# Patient Record
Sex: Female | Born: 1965 | Race: Black or African American | Hispanic: No | Marital: Single | Smoking: Never smoker
Health system: Southern US, Community
[De-identification: ages and names within clinical notes are randomized; demographics above are authoritative.]

## PROBLEM LIST (undated history)

## (undated) DIAGNOSIS — I10 Essential (primary) hypertension: Secondary | ICD-10-CM

---

## 2010-06-08 ENCOUNTER — Encounter (INDEPENDENT_AMBULATORY_CARE_PROVIDER_SITE_OTHER): Payer: Self-pay | Admitting: *Deleted

## 2010-06-08 LAB — CONVERTED CEMR LAB
ALT: 30 units/L (ref 0–35)
AST: 27 units/L (ref 0–37)
Alkaline Phosphatase: 49 units/L (ref 39–117)
Basophils Absolute: 0.1 10*3/uL (ref 0.0–0.1)
Basophils Relative: 1 % (ref 0–1)
CO2: 21 meq/L (ref 19–32)
Creatinine, Ser: 0.83 mg/dL (ref 0.40–1.20)
Eosinophils Relative: 2 % (ref 0–5)
HCT: 22.9 % — ABNORMAL LOW (ref 36.0–46.0)
Lymphocytes Relative: 25 % (ref 12–46)
MCHC: 27.1 g/dL — ABNORMAL LOW (ref 30.0–36.0)
Platelets: 597 10*3/uL — ABNORMAL HIGH (ref 150–400)
RDW: 19.7 % — ABNORMAL HIGH (ref 11.5–15.5)
Sodium: 142 meq/L (ref 135–145)
TSH: 0.94 microintl units/mL (ref 0.350–4.500)
Total Bilirubin: 0.4 mg/dL (ref 0.3–1.2)
Total Protein: 6.6 g/dL (ref 6.0–8.3)

## 2010-06-09 ENCOUNTER — Emergency Department (HOSPITAL_COMMUNITY): Admission: EM | Admit: 2010-06-09 | Discharge: 2010-06-10 | Payer: Self-pay | Admitting: Emergency Medicine

## 2010-06-09 IMAGING — US US TRANSVAGINAL NON-OB
1 series · 14 of 25 positions shown · non-contrast
Comparison: None.

CLINICAL DATA: Heavy vaginal bleeding.  Hypotensive.



[Series 1: us transvaginal non-ob · 0.28mm/px · 14 of 56 slices shown]
[im 1/56]
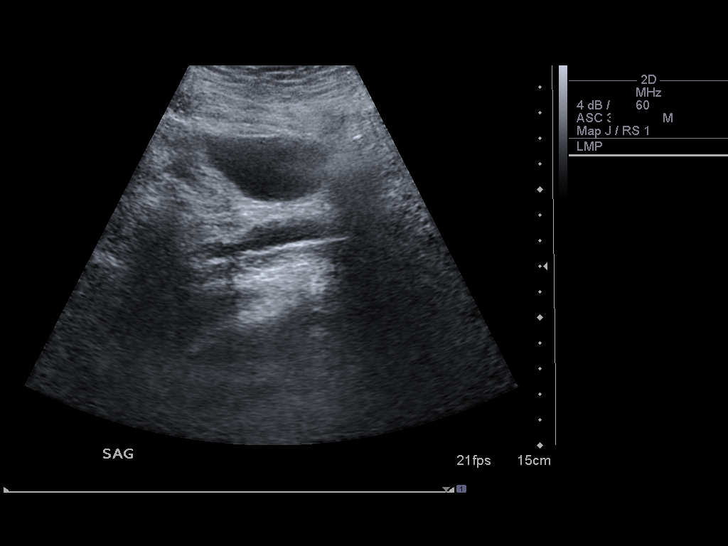
[im 5/56]
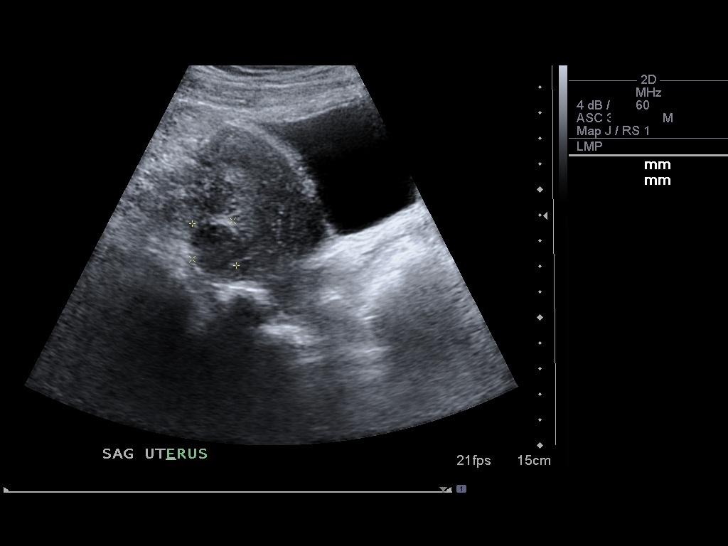
[im 10/56]
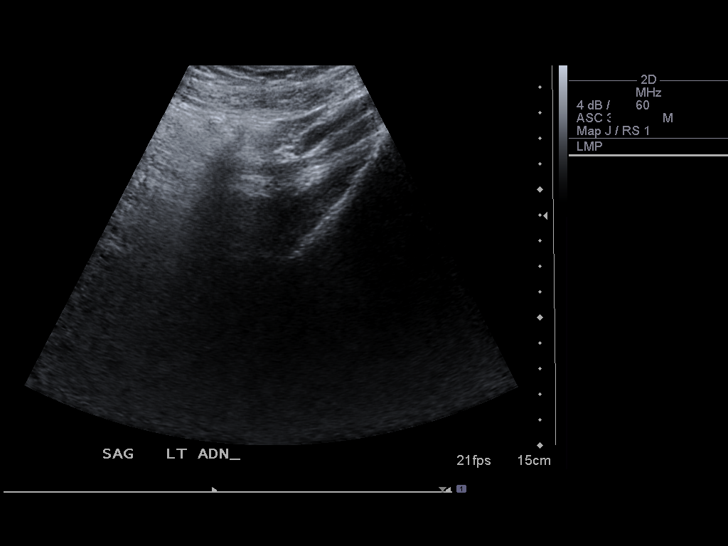
[im 14/56]
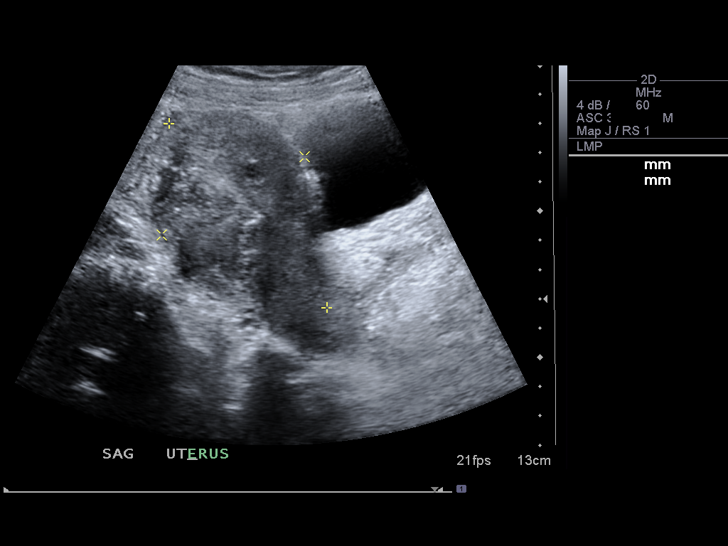
[im 19/56]
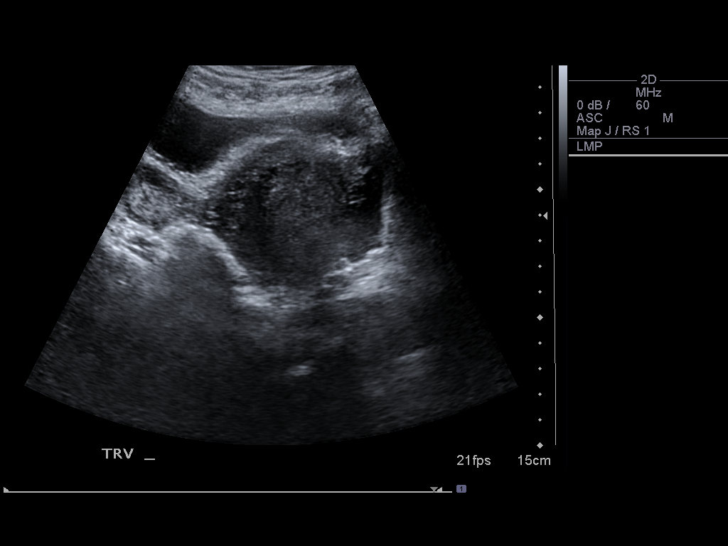
[im 21/56]
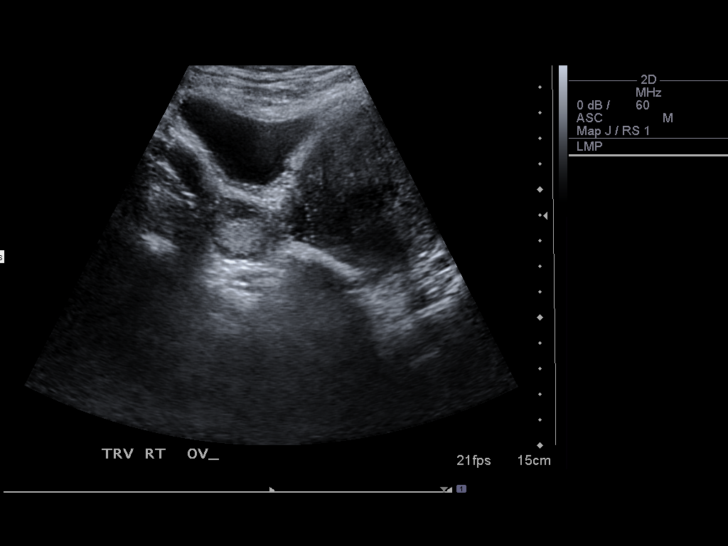
[im 26/56]
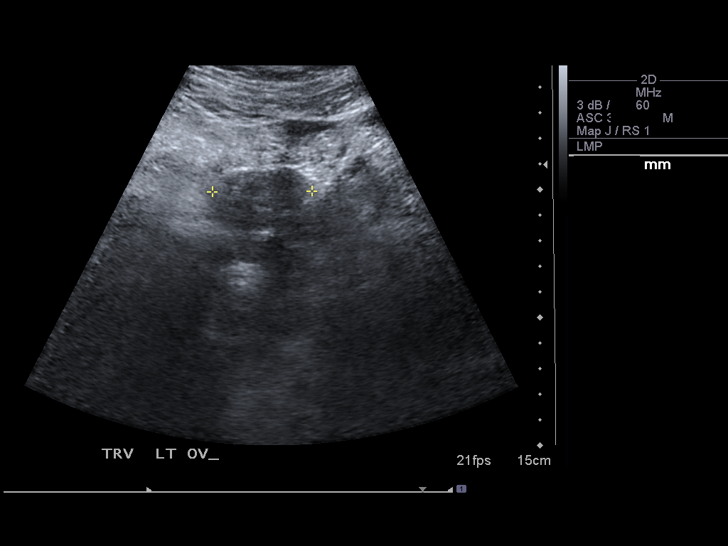
[im 30/56]
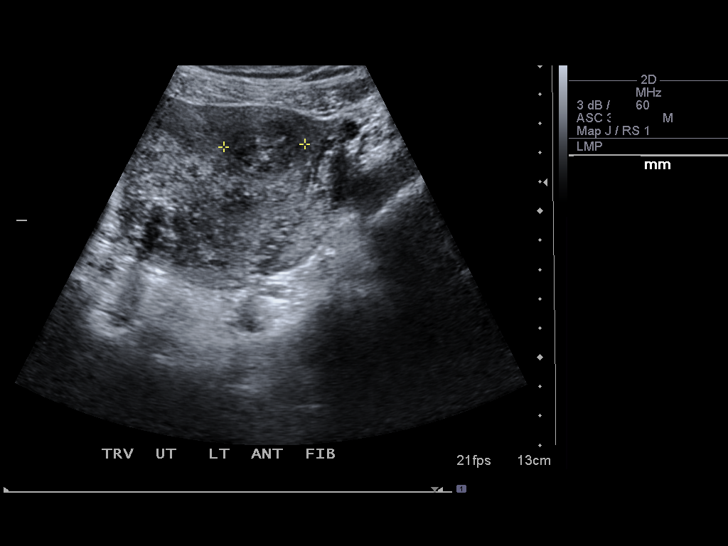
[im 35/56]
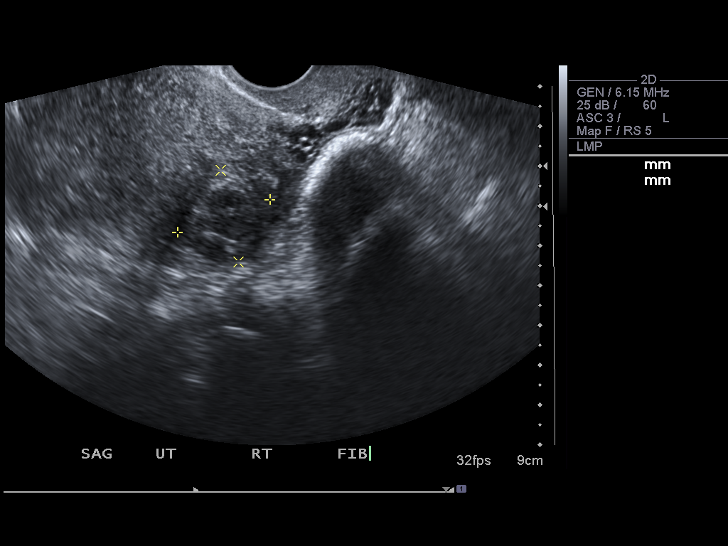
[im 37/56]
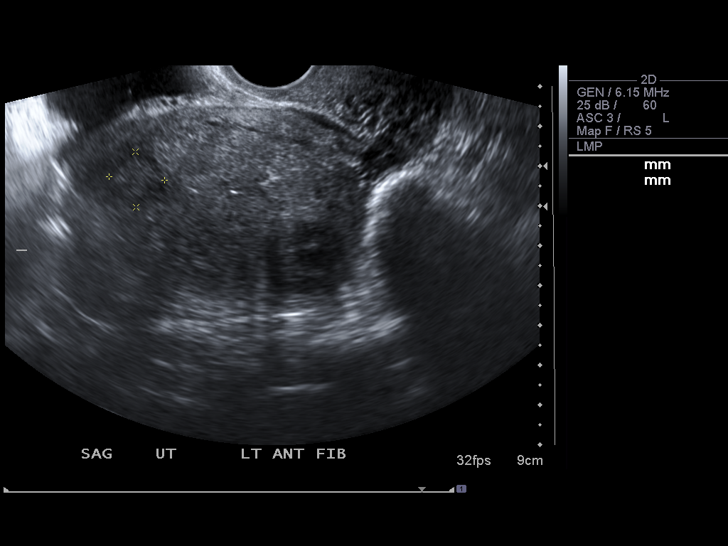
[im 42/56]
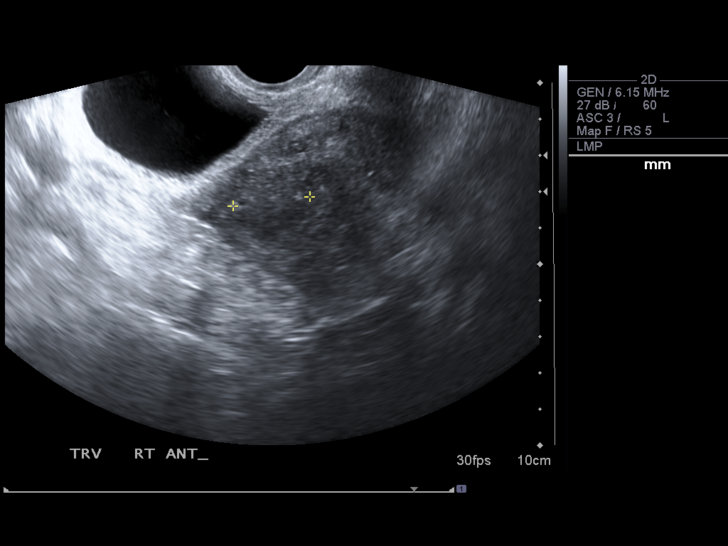
[im 46/56]
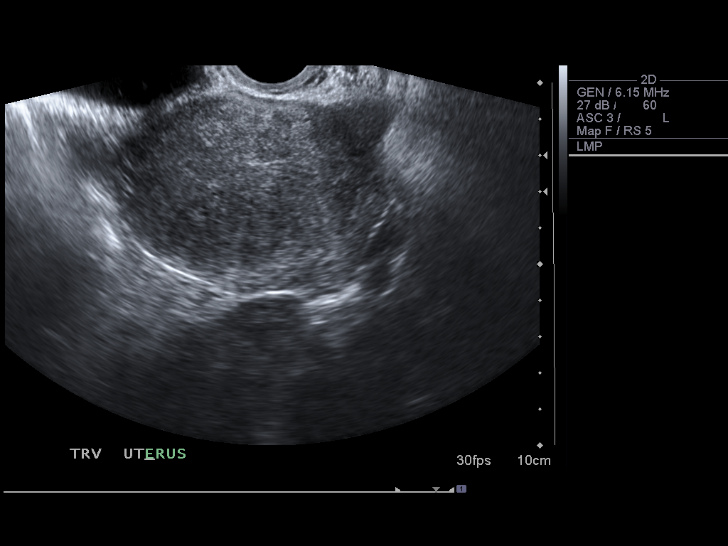
[im 51/56]
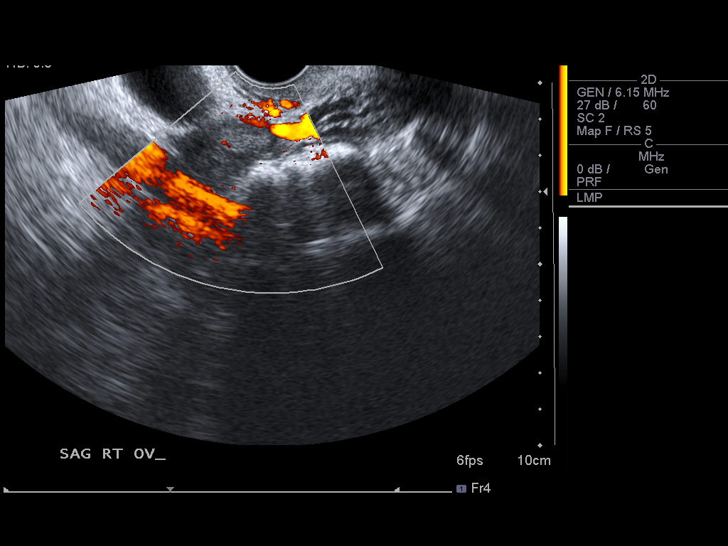
[im 56/56]
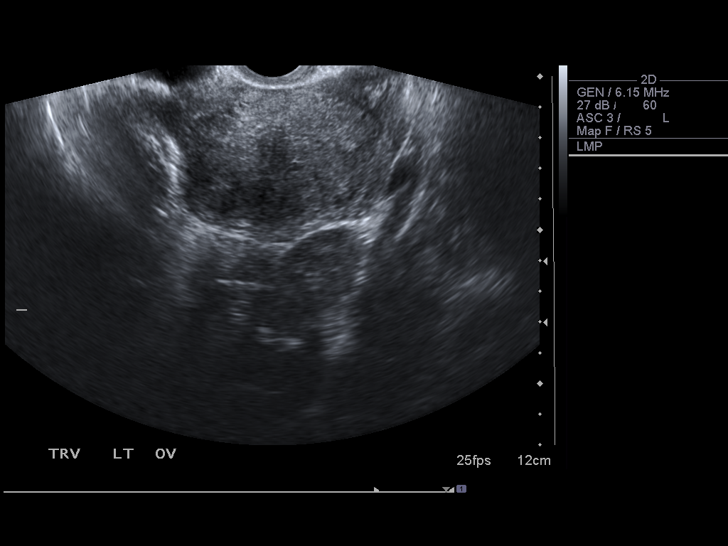

[14 of 25 positions shown; findings below may reference images not displayed]

FINDINGS: Uterus measures 8.3 x 5.6 x 7.1 cm. Uterine myometrium is diffusely
heterogeneous in echotexture.  At least three discrete fibroids are
seen, which range in size from 2.5-2.9 cm.

Endometrium measures 4 mm in thickness.  Minimal fluid or blood
noted in endometrial cavity.

Right Ovary measures 4.2 x 2.8 x 2.5 cm. Normal appearance.

Left Ovary measures 3.6 x 2.6 x 3.9 cm.  Normal appearance.

Other Findings:  No other abnormality identified.
IMPRESSION: 1.  Several small uterine fibroids measuring between two and 3 cm
in diameter.
2.  Normal ovaries.  No evidence of adnexal mass or free fluid.

## 2010-07-13 ENCOUNTER — Encounter (INDEPENDENT_AMBULATORY_CARE_PROVIDER_SITE_OTHER): Payer: Self-pay | Admitting: *Deleted

## 2010-07-13 LAB — CONVERTED CEMR LAB
Eosinophils Absolute: 0.1 10*3/uL (ref 0.0–0.7)
Eosinophils Relative: 1 % (ref 0–5)
Folate: >20 ng/mL
HCT: 41.7 % (ref 36.0–46.0)
Hemoglobin: 13.2 g/dL (ref 12.0–15.0)
Lymphocytes Relative: 20 % (ref 12–46)
Lymphs Abs: 1.2 10*3/uL (ref 0.7–4.0)
Monocytes Absolute: 0.5 10*3/uL (ref 0.1–1.0)
Monocytes Relative: 7 % (ref 3–12)
RBC: 5.14 M/uL — ABNORMAL HIGH (ref 3.87–5.11)
Saturation Ratios: 12 % — ABNORMAL LOW (ref 20–55)
TIBC: 391 ug/dL (ref 250–470)
WBC: 6.2 10*3/uL (ref 4.0–10.5)

## 2010-10-04 ENCOUNTER — Encounter (INDEPENDENT_AMBULATORY_CARE_PROVIDER_SITE_OTHER): Payer: Self-pay | Admitting: *Deleted

## 2010-10-04 LAB — CONVERTED CEMR LAB
Basophils Absolute: 0 10*3/uL (ref 0.0–0.1)
Basophils Relative: 1 % (ref 0–1)
Eosinophils Relative: 3 % (ref 0–5)
HCT: 40.8 % (ref 36.0–46.0)
Hemoglobin: 13.6 g/dL (ref 12.0–15.0)
MCHC: 33.3 g/dL (ref 30.0–36.0)
Monocytes Absolute: 0.3 10*3/uL (ref 0.1–1.0)
Monocytes Relative: 8 % (ref 3–12)
Neutro Abs: 2.1 10*3/uL (ref 1.7–7.7)
RBC: 4.85 M/uL (ref 3.87–5.11)
RDW: 13.2 % (ref 11.5–15.5)

## 2010-10-25 LAB — CBC
HCT: 20.8 % — ABNORMAL LOW (ref 36.0–46.0)
MCHC: 29.3 g/dL — ABNORMAL LOW (ref 30.0–36.0)
MCV: 64 fL — ABNORMAL LOW (ref 78.0–100.0)
Platelets: 676 10*3/uL — ABNORMAL HIGH (ref 150–400)
RDW: 19.5 % — ABNORMAL HIGH (ref 11.5–15.5)
WBC: 5.8 10*3/uL (ref 4.0–10.5)

## 2010-10-25 LAB — DIFFERENTIAL
Basophils Absolute: 0.1 10*3/uL (ref 0.0–0.1)
Basophils Relative: 1 % (ref 0–1)
Eosinophils Absolute: 0.1 10*3/uL (ref 0.0–0.7)
Lymphs Abs: 1.7 10*3/uL (ref 0.7–4.0)
Monocytes Absolute: 0.3 10*3/uL (ref 0.1–1.0)
Neutro Abs: 3.6 10*3/uL (ref 1.7–7.7)

## 2010-10-25 LAB — BASIC METABOLIC PANEL
BUN: 6 mg/dL (ref 6–23)
Chloride: 112 mEq/L (ref 96–112)
Creatinine, Ser: 0.77 mg/dL (ref 0.4–1.2)
GFR calc non Af Amer: >60 mL/min (ref >60)
Glucose, Bld: 90 mg/dL (ref 70–99)
Potassium: 3.6 mEq/L (ref 3.5–5.1)

## 2013-05-01 ENCOUNTER — Other Ambulatory Visit: Payer: Self-pay | Admitting: Specialist

## 2013-05-01 ENCOUNTER — Ambulatory Visit
Admission: RE | Admit: 2013-05-01 | Discharge: 2013-05-01 | Disposition: A | Discharge status: Home / Self Care | Payer: Self-pay | Source: Ambulatory Visit | Attending: Specialist | Admitting: Specialist

## 2013-05-01 DIAGNOSIS — R6889 Other general symptoms and signs: Secondary | ICD-10-CM

## 2013-05-01 IMAGING — CR DG CHEST 1V
1 series · 1 of 1 positions shown · non-contrast
Comparison: None.

CLINICAL DATA: Positive PPD. Asymptomatic.

EXAM:
CHEST - 1 VIEW

[w chest pa]
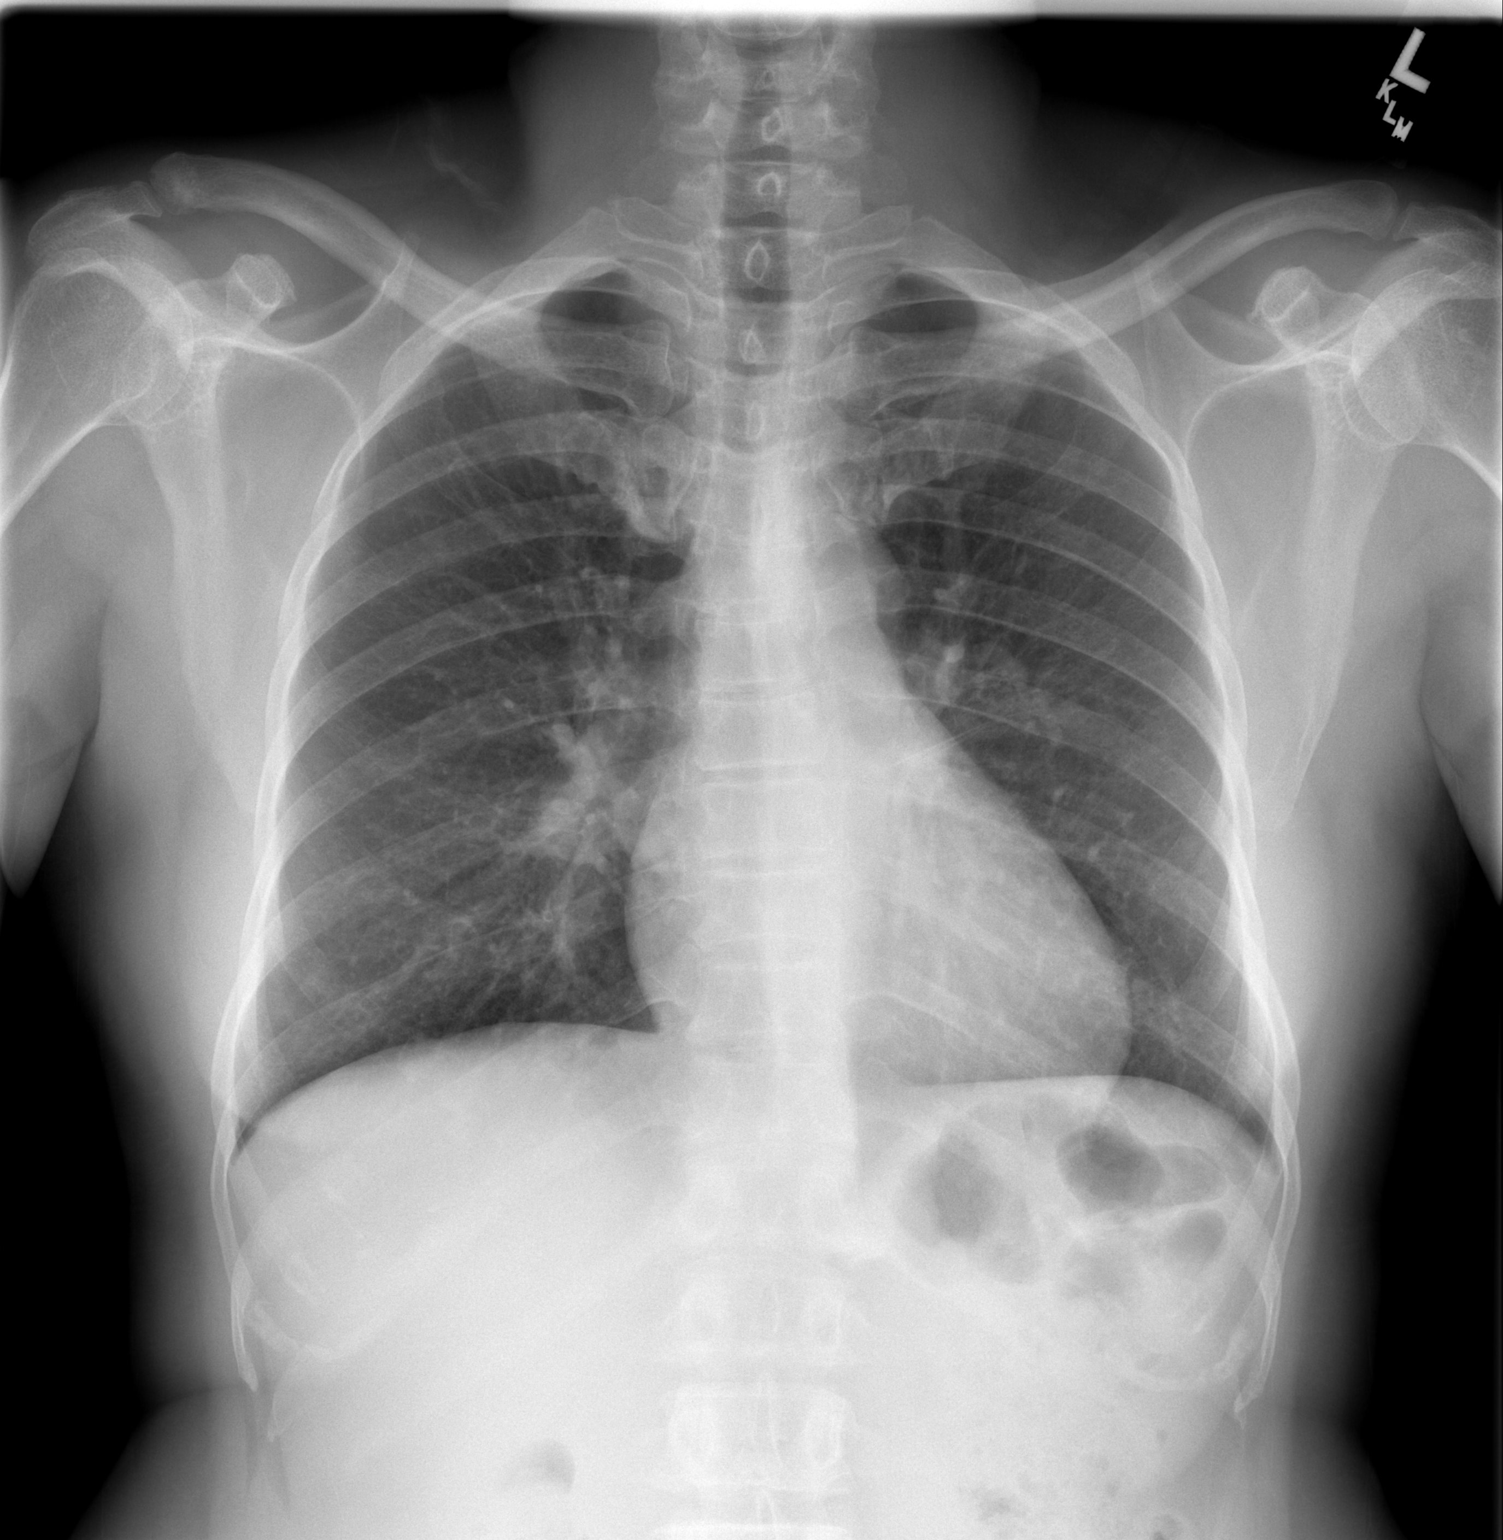

[1 of 1 positions shown; findings below may reference images not displayed]

FINDINGS: The heart size and mediastinal contours are within normal limits.
Both lungs are clear. The visualized skeletal structures are
unremarkable.
IMPRESSION: No active cardiopulmonary process. No radiographic evidence of
tuberculosis.

## 2015-05-05 ENCOUNTER — Other Ambulatory Visit (HOSPITAL_COMMUNITY)
Admission: RE | Admit: 2015-05-05 | Discharge: 2015-05-05 | Disposition: A | Discharge status: Home / Self Care | Payer: Managed Care, Other (non HMO) | Source: Ambulatory Visit | Attending: Internal Medicine | Admitting: Internal Medicine

## 2015-05-05 DIAGNOSIS — Z01419 Encounter for gynecological examination (general) (routine) without abnormal findings: Secondary | ICD-10-CM | POA: Insufficient documentation

## 2016-05-29 ENCOUNTER — Other Ambulatory Visit: Payer: Self-pay | Admitting: Internal Medicine

## 2016-05-29 DIAGNOSIS — M5440 Lumbago with sciatica, unspecified side: Secondary | ICD-10-CM

## 2016-06-23 ENCOUNTER — Ambulatory Visit
Admission: RE | Admit: 2016-06-23 | Discharge: 2016-06-23 | Disposition: A | Discharge status: Home / Self Care | Payer: Managed Care, Other (non HMO) | Source: Ambulatory Visit | Attending: Internal Medicine | Admitting: Internal Medicine

## 2016-06-23 DIAGNOSIS — M5440 Lumbago with sciatica, unspecified side: Secondary | ICD-10-CM

## 2016-09-17 ENCOUNTER — Ambulatory Visit: Payer: BLUE CROSS/BLUE SHIELD | Admitting: Physical Therapy

## 2016-12-24 DIAGNOSIS — M792 Neuralgia and neuritis, unspecified: Secondary | ICD-10-CM | POA: Diagnosis not present

## 2016-12-24 DIAGNOSIS — R5383 Other fatigue: Secondary | ICD-10-CM | POA: Diagnosis not present

## 2016-12-24 DIAGNOSIS — G5602 Carpal tunnel syndrome, left upper limb: Secondary | ICD-10-CM | POA: Diagnosis not present

## 2017-01-03 DIAGNOSIS — M5136 Other intervertebral disc degeneration, lumbar region: Secondary | ICD-10-CM | POA: Diagnosis not present

## 2017-01-04 ENCOUNTER — Other Ambulatory Visit: Payer: Self-pay | Admitting: Internal Medicine

## 2017-01-04 DIAGNOSIS — M5136 Other intervertebral disc degeneration, lumbar region: Secondary | ICD-10-CM

## 2017-01-11 ENCOUNTER — Ambulatory Visit
Admission: RE | Admit: 2017-01-11 | Discharge: 2017-01-11 | Disposition: A | Discharge status: Home / Self Care | Payer: BLUE CROSS/BLUE SHIELD | Source: Ambulatory Visit | Attending: Internal Medicine | Admitting: Internal Medicine

## 2017-01-11 DIAGNOSIS — M47817 Spondylosis without myelopathy or radiculopathy, lumbosacral region: Secondary | ICD-10-CM | POA: Diagnosis not present

## 2017-01-11 DIAGNOSIS — M5136 Other intervertebral disc degeneration, lumbar region: Secondary | ICD-10-CM

## 2017-01-11 IMAGING — XA Imaging study
1 series · 1 of 1 positions shown · non-contrast
Comparison: none

CLINICAL DATA: Lumbosacral spondylosis without myelopathy. LEFT leg
radicular symptoms.

[Series 1: ortho standard · 1 of 1 slices shown]
[im 1/1]
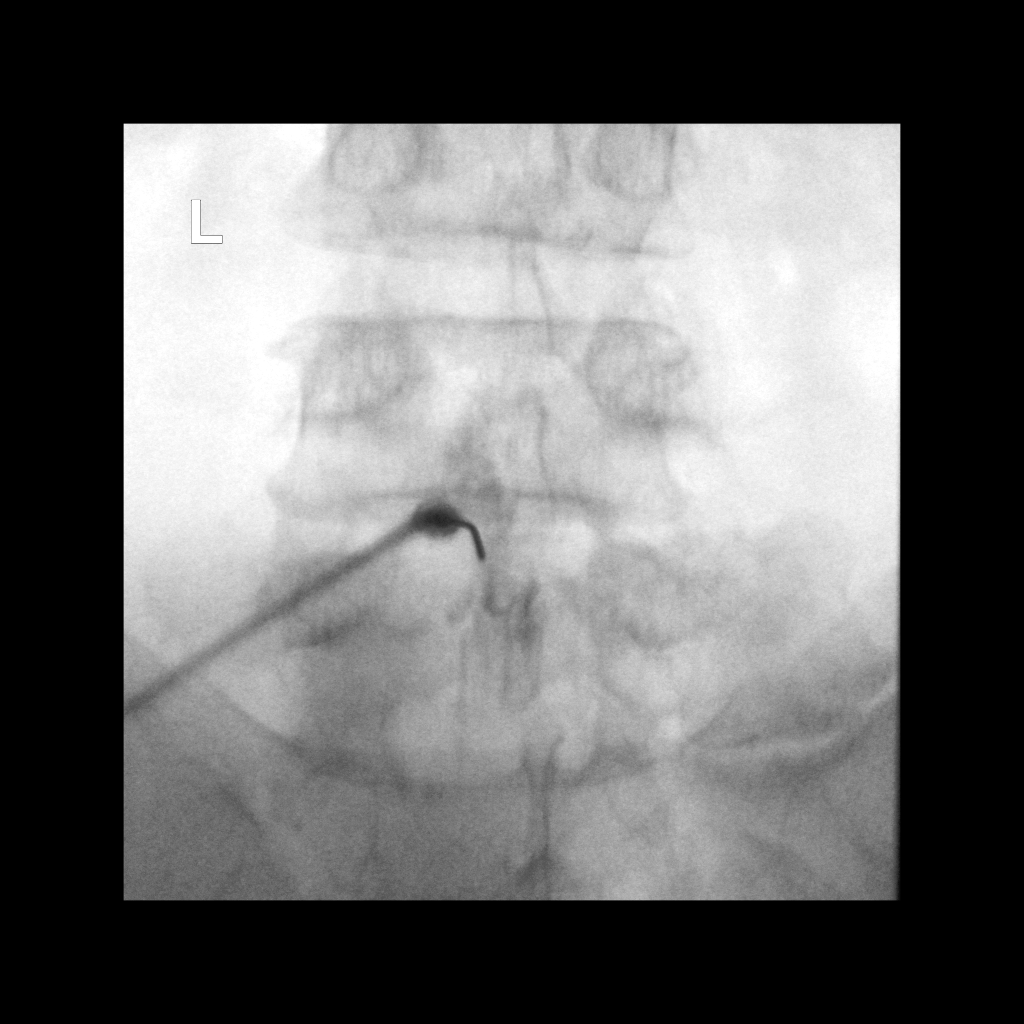

[1 of 1 positions shown; findings below may reference images not displayed]

FLUOROSCOPY TIME:  7 seconds corresponding to a Dose Area Product of
11.73 ?Gy*m2

PROCEDURE:
The procedure, risks, benefits, and alternatives were explained to
the patient. Questions regarding the procedure were encouraged and
answered. The patient understands and consents to the procedure.

LUMBAR EPIDURAL INJECTION:

An interlaminar approach was performed on the LEFT at L4-5. The
overlying skin was cleansed and anesthetized. A 20 gauge epidural
needle was advanced using loss-of-resistance technique.

DIAGNOSTIC EPIDURAL INJECTION:

Injection of Isovue-M 200 shows a good epidural pattern with spread
above and below the level of needle placement, primarily on the
LEFT; no vascular opacification is seen.

THERAPEUTIC EPIDURAL INJECTION:

120.0 Mg of Depo-Medrol mixed with 2 mL 1% lidocaine were instilled.
The procedure was well-tolerated, and the patient was discharged
thirty minutes following the injection in good condition.

COMPLICATIONS:
None.
IMPRESSION: Technically successful epidural injection on the LEFT L4-5 # 1.

## 2017-01-11 MED ORDER — IOPAMIDOL (ISOVUE-M 200) INJECTION 41%
1.0000 mL | Freq: Once | INTRAMUSCULAR | Status: AC
  Administered 2017-01-11: 1 mL via EPIDURAL

## 2017-01-11 MED ORDER — METHYLPREDNISOLONE ACETATE 40 MG/ML INJ SUSP (RADIOLOG
120.0000 mg | Freq: Once | INTRAMUSCULAR | Status: AC
  Administered 2017-01-11: 120 mg via EPIDURAL

## 2017-01-11 NOTE — Discharge Instructions (Signed)

## 2017-07-08 ENCOUNTER — Other Ambulatory Visit: Payer: Self-pay | Admitting: Internal Medicine

## 2017-07-08 DIAGNOSIS — M5136 Other intervertebral disc degeneration, lumbar region: Secondary | ICD-10-CM

## 2017-07-09 ENCOUNTER — Other Ambulatory Visit: Payer: Self-pay | Admitting: Internal Medicine

## 2017-07-09 DIAGNOSIS — M5136 Other intervertebral disc degeneration, lumbar region: Secondary | ICD-10-CM

## 2017-07-11 DIAGNOSIS — Z131 Encounter for screening for diabetes mellitus: Secondary | ICD-10-CM | POA: Diagnosis not present

## 2017-07-11 DIAGNOSIS — Z0000 Encounter for general adult medical examination without abnormal findings: Secondary | ICD-10-CM | POA: Diagnosis not present

## 2017-07-11 DIAGNOSIS — I10 Essential (primary) hypertension: Secondary | ICD-10-CM | POA: Diagnosis not present

## 2017-07-16 ENCOUNTER — Ambulatory Visit
Admission: RE | Admit: 2017-07-16 | Discharge: 2017-07-16 | Disposition: A | Discharge status: Home / Self Care | Payer: BLUE CROSS/BLUE SHIELD | Source: Ambulatory Visit | Attending: Internal Medicine | Admitting: Internal Medicine

## 2017-07-16 DIAGNOSIS — M5126 Other intervertebral disc displacement, lumbar region: Secondary | ICD-10-CM | POA: Diagnosis not present

## 2017-07-16 DIAGNOSIS — M5136 Other intervertebral disc degeneration, lumbar region: Secondary | ICD-10-CM

## 2017-07-16 IMAGING — XA Imaging study
2 series · 2 of 2 positions shown · non-contrast
Comparison: none

CLINICAL DATA: Spondylosis without myelopathy. Disc herniation
L4-5. Advanced facet arthropathy. Some improvement from the previous
injection but with recurrence of symptoms.

[Series 1: ortho standard · 1 of 1 slices shown (1 of 2)]
[im 1/1]
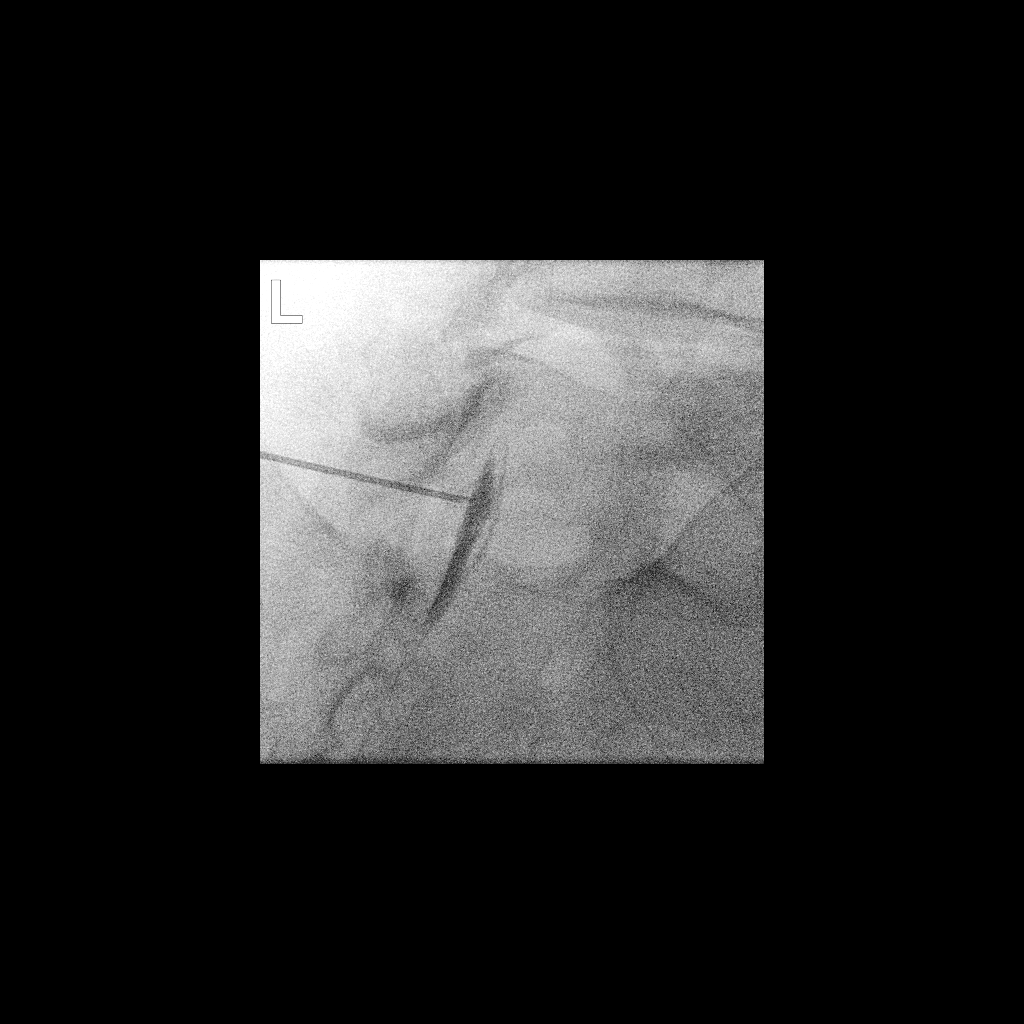

[Series 2: ortho standard · 1 of 1 slices shown (2 of 2)]
[im 1/1]
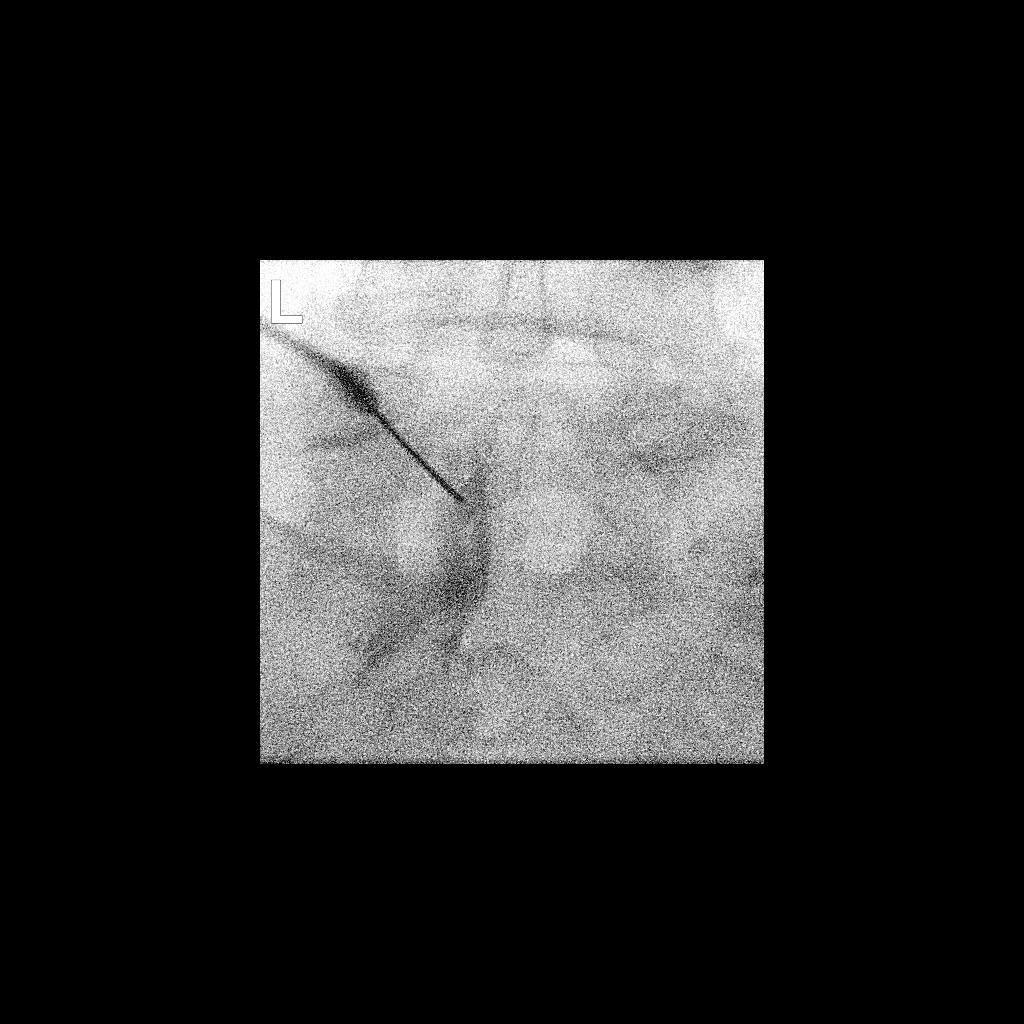

[2 of 2 positions shown; findings below may reference images not displayed]

FLUOROSCOPY TIME:  0 minutes 15 seconds. 11.64 micro gray meter
squared

PROCEDURE:
The procedure, risks, benefits, and alternatives were explained to
the patient. Questions regarding the procedure were encouraged and
answered. The patient understands and consents to the procedure.

LUMBAR EPIDURAL INJECTION:

An interlaminar approach was performed on left at L5-S1. The
overlying skin was cleansed and anesthetized. A 20 gauge epidural
needle was advanced using loss-of-resistance technique.

DIAGNOSTIC EPIDURAL INJECTION:

Injection of Isovue-M 200 shows a good epidural pattern with spread
above and below the level of needle placement, primarily on the
left. No vascular opacification is seen.

THERAPEUTIC EPIDURAL INJECTION:

One hundred twenty Mg of Depo-Medrol mixed with 2.5 cc 1% lidocaine
were instilled. The procedure was well-tolerated, and the patient
was discharged thirty minutes following the injection in good
condition.

COMPLICATIONS:
None
IMPRESSION: Technically successful epidural injection on the left at L5-S1 # 2.

## 2017-07-16 MED ORDER — IOPAMIDOL (ISOVUE-M 200) INJECTION 41%
1.0000 mL | Freq: Once | INTRAMUSCULAR | Status: AC
  Administered 2017-07-16: 1 mL via EPIDURAL

## 2017-07-16 MED ORDER — METHYLPREDNISOLONE ACETATE 40 MG/ML INJ SUSP (RADIOLOG
120.0000 mg | Freq: Once | INTRAMUSCULAR | Status: AC
  Administered 2017-07-16: 120 mg via EPIDURAL

## 2017-07-16 NOTE — Discharge Instructions (Signed)

## 2017-07-18 DIAGNOSIS — Z0000 Encounter for general adult medical examination without abnormal findings: Secondary | ICD-10-CM | POA: Diagnosis not present

## 2017-07-18 DIAGNOSIS — Z23 Encounter for immunization: Secondary | ICD-10-CM | POA: Diagnosis not present

## 2017-07-18 DIAGNOSIS — I10 Essential (primary) hypertension: Secondary | ICD-10-CM | POA: Diagnosis not present

## 2017-07-18 DIAGNOSIS — M5136 Other intervertebral disc degeneration, lumbar region: Secondary | ICD-10-CM | POA: Diagnosis not present

## 2017-08-15 DIAGNOSIS — Z23 Encounter for immunization: Secondary | ICD-10-CM | POA: Diagnosis not present

## 2017-09-03 ENCOUNTER — Encounter: Payer: Self-pay | Admitting: Internal Medicine

## 2017-12-10 DIAGNOSIS — M5136 Other intervertebral disc degeneration, lumbar region: Secondary | ICD-10-CM | POA: Diagnosis not present

## 2018-01-30 DIAGNOSIS — M545 Low back pain: Secondary | ICD-10-CM | POA: Diagnosis not present

## 2018-01-30 DIAGNOSIS — M4316 Spondylolisthesis, lumbar region: Secondary | ICD-10-CM | POA: Diagnosis not present

## 2018-01-30 DIAGNOSIS — M48062 Spinal stenosis, lumbar region with neurogenic claudication: Secondary | ICD-10-CM | POA: Diagnosis not present

## 2018-02-27 DIAGNOSIS — M4316 Spondylolisthesis, lumbar region: Secondary | ICD-10-CM | POA: Diagnosis not present

## 2018-02-27 DIAGNOSIS — M48062 Spinal stenosis, lumbar region with neurogenic claudication: Secondary | ICD-10-CM | POA: Diagnosis not present

## 2018-02-27 DIAGNOSIS — M545 Low back pain: Secondary | ICD-10-CM | POA: Diagnosis not present

## 2018-03-10 DIAGNOSIS — M5432 Sciatica, left side: Secondary | ICD-10-CM | POA: Diagnosis not present

## 2018-03-10 DIAGNOSIS — M6281 Muscle weakness (generalized): Secondary | ICD-10-CM | POA: Diagnosis not present

## 2018-03-10 DIAGNOSIS — M545 Low back pain: Secondary | ICD-10-CM | POA: Diagnosis not present

## 2018-03-20 DIAGNOSIS — M6281 Muscle weakness (generalized): Secondary | ICD-10-CM | POA: Diagnosis not present

## 2018-03-20 DIAGNOSIS — M545 Low back pain: Secondary | ICD-10-CM | POA: Diagnosis not present

## 2018-03-20 DIAGNOSIS — M5432 Sciatica, left side: Secondary | ICD-10-CM | POA: Diagnosis not present

## 2018-04-10 DIAGNOSIS — M545 Low back pain: Secondary | ICD-10-CM | POA: Diagnosis not present

## 2018-04-10 DIAGNOSIS — M5432 Sciatica, left side: Secondary | ICD-10-CM | POA: Diagnosis not present

## 2018-04-10 DIAGNOSIS — M6281 Muscle weakness (generalized): Secondary | ICD-10-CM | POA: Diagnosis not present

## 2018-04-21 DIAGNOSIS — M4316 Spondylolisthesis, lumbar region: Secondary | ICD-10-CM | POA: Diagnosis not present

## 2018-04-21 DIAGNOSIS — M545 Low back pain: Secondary | ICD-10-CM | POA: Diagnosis not present

## 2018-04-21 DIAGNOSIS — M48062 Spinal stenosis, lumbar region with neurogenic claudication: Secondary | ICD-10-CM | POA: Diagnosis not present

## 2018-07-17 DIAGNOSIS — Z0000 Encounter for general adult medical examination without abnormal findings: Secondary | ICD-10-CM | POA: Diagnosis not present

## 2018-07-17 DIAGNOSIS — I10 Essential (primary) hypertension: Secondary | ICD-10-CM | POA: Diagnosis not present

## 2018-07-24 DIAGNOSIS — Z0000 Encounter for general adult medical examination without abnormal findings: Secondary | ICD-10-CM | POA: Diagnosis not present

## 2018-07-24 DIAGNOSIS — I10 Essential (primary) hypertension: Secondary | ICD-10-CM | POA: Diagnosis not present

## 2018-07-24 DIAGNOSIS — M5440 Lumbago with sciatica, unspecified side: Secondary | ICD-10-CM | POA: Diagnosis not present

## 2018-08-25 DIAGNOSIS — Z1211 Encounter for screening for malignant neoplasm of colon: Secondary | ICD-10-CM | POA: Diagnosis not present

## 2018-08-25 DIAGNOSIS — Z1212 Encounter for screening for malignant neoplasm of rectum: Secondary | ICD-10-CM | POA: Diagnosis not present

## 2018-09-04 DIAGNOSIS — R55 Syncope and collapse: Secondary | ICD-10-CM | POA: Diagnosis not present

## 2018-09-04 DIAGNOSIS — I10 Essential (primary) hypertension: Secondary | ICD-10-CM | POA: Diagnosis not present

## 2019-03-05 DIAGNOSIS — N951 Menopausal and female climacteric states: Secondary | ICD-10-CM | POA: Diagnosis not present

## 2019-03-05 DIAGNOSIS — R55 Syncope and collapse: Secondary | ICD-10-CM | POA: Diagnosis not present

## 2019-03-05 DIAGNOSIS — M5440 Lumbago with sciatica, unspecified side: Secondary | ICD-10-CM | POA: Diagnosis not present

## 2019-03-05 DIAGNOSIS — I10 Essential (primary) hypertension: Secondary | ICD-10-CM | POA: Diagnosis not present

## 2019-06-29 ENCOUNTER — Other Ambulatory Visit: Payer: Self-pay | Admitting: Internal Medicine

## 2019-06-29 DIAGNOSIS — Z1231 Encounter for screening mammogram for malignant neoplasm of breast: Secondary | ICD-10-CM

## 2019-08-20 ENCOUNTER — Other Ambulatory Visit: Payer: Self-pay

## 2019-08-20 ENCOUNTER — Ambulatory Visit
Admission: RE | Admit: 2019-08-20 | Discharge: 2019-08-20 | Disposition: A | Discharge status: Home / Self Care | Payer: BC Managed Care – PPO | Source: Ambulatory Visit | Attending: Internal Medicine | Admitting: Internal Medicine

## 2019-08-20 DIAGNOSIS — Z1231 Encounter for screening mammogram for malignant neoplasm of breast: Secondary | ICD-10-CM

## 2019-09-01 DIAGNOSIS — I10 Essential (primary) hypertension: Secondary | ICD-10-CM | POA: Diagnosis not present

## 2019-09-01 DIAGNOSIS — Z0000 Encounter for general adult medical examination without abnormal findings: Secondary | ICD-10-CM | POA: Diagnosis not present

## 2019-09-03 DIAGNOSIS — Z0000 Encounter for general adult medical examination without abnormal findings: Secondary | ICD-10-CM | POA: Diagnosis not present

## 2019-09-03 DIAGNOSIS — I10 Essential (primary) hypertension: Secondary | ICD-10-CM | POA: Diagnosis not present

## 2019-09-03 DIAGNOSIS — R55 Syncope and collapse: Secondary | ICD-10-CM | POA: Diagnosis not present

## 2019-09-03 DIAGNOSIS — L309 Dermatitis, unspecified: Secondary | ICD-10-CM | POA: Diagnosis not present

## 2020-03-24 DIAGNOSIS — I10 Essential (primary) hypertension: Secondary | ICD-10-CM | POA: Diagnosis not present

## 2020-03-31 DIAGNOSIS — I10 Essential (primary) hypertension: Secondary | ICD-10-CM | POA: Diagnosis not present

## 2020-03-31 DIAGNOSIS — R109 Unspecified abdominal pain: Secondary | ICD-10-CM | POA: Diagnosis not present

## 2020-03-31 DIAGNOSIS — M5136 Other intervertebral disc degeneration, lumbar region: Secondary | ICD-10-CM | POA: Diagnosis not present

## 2020-03-31 DIAGNOSIS — R252 Cramp and spasm: Secondary | ICD-10-CM | POA: Diagnosis not present

## 2020-07-11 ENCOUNTER — Other Ambulatory Visit: Payer: Self-pay | Admitting: Internal Medicine

## 2020-07-11 DIAGNOSIS — Z1231 Encounter for screening mammogram for malignant neoplasm of breast: Secondary | ICD-10-CM

## 2020-08-22 ENCOUNTER — Other Ambulatory Visit: Payer: Self-pay

## 2020-08-22 ENCOUNTER — Ambulatory Visit
Admission: RE | Admit: 2020-08-22 | Discharge: 2020-08-22 | Disposition: A | Discharge status: Home / Self Care | Payer: BC Managed Care – PPO | Source: Ambulatory Visit | Attending: Internal Medicine | Admitting: Internal Medicine

## 2020-08-22 DIAGNOSIS — Z1231 Encounter for screening mammogram for malignant neoplasm of breast: Secondary | ICD-10-CM

## 2020-09-23 DIAGNOSIS — I10 Essential (primary) hypertension: Secondary | ICD-10-CM | POA: Diagnosis not present

## 2020-09-23 DIAGNOSIS — R109 Unspecified abdominal pain: Secondary | ICD-10-CM | POA: Diagnosis not present

## 2020-09-29 DIAGNOSIS — I10 Essential (primary) hypertension: Secondary | ICD-10-CM | POA: Diagnosis not present

## 2020-09-29 DIAGNOSIS — Z0000 Encounter for general adult medical examination without abnormal findings: Secondary | ICD-10-CM | POA: Diagnosis not present

## 2020-11-11 ENCOUNTER — Emergency Department (HOSPITAL_COMMUNITY): Payer: BC Managed Care – PPO

## 2020-11-11 ENCOUNTER — Inpatient Hospital Stay (HOSPITAL_COMMUNITY)
Admission: EM | Admit: 2020-11-11 | Discharge: 2020-11-17 | DRG: 096 | Disposition: A | Discharge status: Home / Self Care | Payer: BC Managed Care – PPO | Attending: Internal Medicine | Admitting: Internal Medicine

## 2020-11-11 ENCOUNTER — Other Ambulatory Visit: Payer: Self-pay

## 2020-11-11 ENCOUNTER — Encounter (HOSPITAL_COMMUNITY): Payer: Self-pay | Admitting: Emergency Medicine

## 2020-11-11 ENCOUNTER — Ambulatory Visit (HOSPITAL_COMMUNITY)
Admission: EM | Admit: 2020-11-11 | Discharge: 2020-11-11 | Disposition: A | Discharge status: Home / Self Care | Payer: BC Managed Care – PPO

## 2020-11-11 ENCOUNTER — Encounter (HOSPITAL_COMMUNITY): Payer: Self-pay

## 2020-11-11 ENCOUNTER — Observation Stay (HOSPITAL_COMMUNITY): Payer: BC Managed Care – PPO

## 2020-11-11 DIAGNOSIS — G629 Polyneuropathy, unspecified: Secondary | ICD-10-CM | POA: Diagnosis present

## 2020-11-11 DIAGNOSIS — I10 Essential (primary) hypertension: Secondary | ICD-10-CM | POA: Diagnosis not present

## 2020-11-11 DIAGNOSIS — G053 Encephalitis and encephalomyelitis in diseases classified elsewhere: Secondary | ICD-10-CM | POA: Diagnosis present

## 2020-11-11 DIAGNOSIS — G8929 Other chronic pain: Secondary | ICD-10-CM | POA: Diagnosis present

## 2020-11-11 DIAGNOSIS — M6281 Muscle weakness (generalized): Secondary | ICD-10-CM

## 2020-11-11 DIAGNOSIS — Z79899 Other long term (current) drug therapy: Secondary | ICD-10-CM | POA: Diagnosis not present

## 2020-11-11 DIAGNOSIS — M542 Cervicalgia: Secondary | ICD-10-CM

## 2020-11-11 DIAGNOSIS — M549 Dorsalgia, unspecified: Secondary | ICD-10-CM | POA: Diagnosis not present

## 2020-11-11 DIAGNOSIS — G510 Bell's palsy: Secondary | ICD-10-CM | POA: Diagnosis not present

## 2020-11-11 DIAGNOSIS — G35 Multiple sclerosis: Secondary | ICD-10-CM | POA: Diagnosis not present

## 2020-11-11 DIAGNOSIS — M4802 Spinal stenosis, cervical region: Secondary | ICD-10-CM | POA: Diagnosis not present

## 2020-11-11 DIAGNOSIS — R519 Headache, unspecified: Secondary | ICD-10-CM | POA: Diagnosis not present

## 2020-11-11 DIAGNOSIS — Z20822 Contact with and (suspected) exposure to covid-19: Secondary | ICD-10-CM | POA: Diagnosis not present

## 2020-11-11 DIAGNOSIS — M359 Systemic involvement of connective tissue, unspecified: Secondary | ICD-10-CM | POA: Diagnosis not present

## 2020-11-11 DIAGNOSIS — Z880 Allergy status to penicillin: Secondary | ICD-10-CM | POA: Diagnosis not present

## 2020-11-11 DIAGNOSIS — R42 Dizziness and giddiness: Secondary | ICD-10-CM | POA: Diagnosis not present

## 2020-11-11 DIAGNOSIS — G610 Guillain-Barre syndrome: Secondary | ICD-10-CM | POA: Diagnosis not present

## 2020-11-11 DIAGNOSIS — R29810 Facial weakness: Secondary | ICD-10-CM | POA: Diagnosis not present

## 2020-11-11 DIAGNOSIS — R531 Weakness: Secondary | ICD-10-CM | POA: Diagnosis not present

## 2020-11-11 DIAGNOSIS — D472 Monoclonal gammopathy: Secondary | ICD-10-CM | POA: Diagnosis not present

## 2020-11-11 DIAGNOSIS — G0481 Other encephalitis and encephalomyelitis: Secondary | ICD-10-CM | POA: Diagnosis not present

## 2020-11-11 HISTORY — DX: Essential (primary) hypertension: I10

## 2020-11-11 LAB — CBC WITH DIFFERENTIAL/PLATELET
Abs Immature Granulocytes: 0.01 10*3/uL (ref 0.00–0.07)
Basophils Absolute: 0 10*3/uL (ref 0.0–0.1)
Basophils Relative: 1 %
Eosinophils Absolute: 0.1 10*3/uL (ref 0.0–0.5)
Eosinophils Relative: 1 %
HCT: 39.1 % (ref 36.0–46.0)
Hemoglobin: 12.9 g/dL (ref 12.0–15.0)
Immature Granulocytes: 0 %
Lymphocytes Relative: 28 %
Lymphs Abs: 1.3 10*3/uL (ref 0.7–4.0)
MCH: 27.7 pg (ref 26.0–34.0)
MCHC: 33 g/dL (ref 30.0–36.0)
MCV: 84.1 fL (ref 80.0–100.0)
Monocytes Absolute: 0.4 10*3/uL (ref 0.1–1.0)
Monocytes Relative: 8 %
Neutro Abs: 2.8 10*3/uL (ref 1.7–7.7)
Neutrophils Relative %: 62 %
Platelets: 478 10*3/uL — ABNORMAL HIGH (ref 150–400)
RBC: 4.65 MIL/uL (ref 3.87–5.11)
RDW: 13.2 % (ref 11.5–15.5)
WBC: 4.5 10*3/uL (ref 4.0–10.5)
nRBC: 0 % (ref 0.0–0.2)

## 2020-11-11 LAB — POC SARS CORONAVIRUS 2 AG -  ED: SARS Coronavirus 2 Ag: NEGATIVE

## 2020-11-11 LAB — MAGNESIUM: Magnesium: 2 mg/dL (ref 1.7–2.4)

## 2020-11-11 LAB — BASIC METABOLIC PANEL
Anion gap: 7 (ref 5–15)
BUN: 6 mg/dL (ref 6–20)
CO2: 24 mmol/L (ref 22–32)
Calcium: 9.2 mg/dL (ref 8.9–10.3)
Chloride: 109 mmol/L (ref 98–111)
Creatinine, Ser: 0.56 mg/dL (ref 0.44–1.00)
GFR, Estimated: >60 mL/min (ref >60)
Glucose, Bld: 107 mg/dL — ABNORMAL HIGH (ref 70–99)
Potassium: 3.8 mmol/L (ref 3.5–5.1)
Sodium: 140 mmol/L (ref 135–145)

## 2020-11-11 LAB — CK: Total CK: 247 U/L — ABNORMAL HIGH (ref 38–234)

## 2020-11-11 LAB — HIV ANTIBODY (ROUTINE TESTING W REFLEX): HIV Screen 4th Generation wRfx: NONREACTIVE

## 2020-11-11 LAB — VITAMIN B12: Vitamin B-12: 1346 pg/mL — ABNORMAL HIGH (ref 180–914)

## 2020-11-11 IMAGING — MR MR CERVICAL SPINE WO/W CM
4 of 8 series · 19 of 48 positions shown · IV contrast (8 ML GAD)
Comparison: None.

CLINICAL DATA: Evaluate for demyelinating disease.

EXAM:
MRI CERVICAL SPINE WITHOUT AND WITH CONTRAST
TECHNIQUE: Multiplanar and multiecho pulse sequences of the cervical spine, to
include the craniocervical junction and cervicothoracic junction,
were obtained without and with intravenous contrast.
CONTRAST:  8mL GADAVIST GADOBUTROL 1 MMOL/ML IV SOLN

[Series 4: T2 · sagittal · 3.0mm · 0.43mm/px · 4 of 18 slices shown (1 of 2)]
[im 1/18]
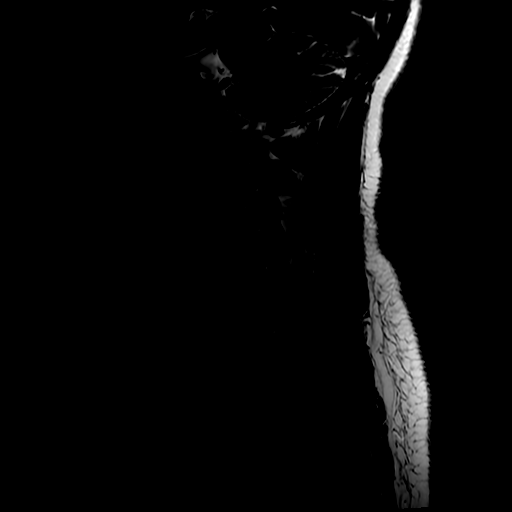
[im 6/18]
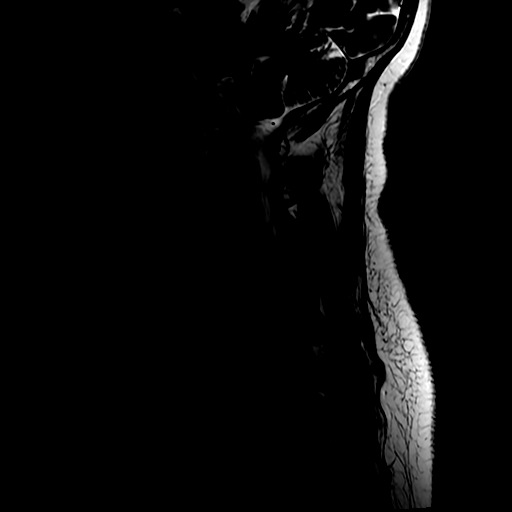
[im 12/18]
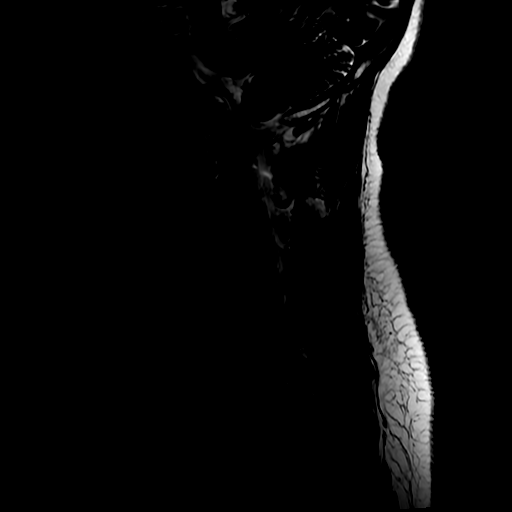
[im 18/18]
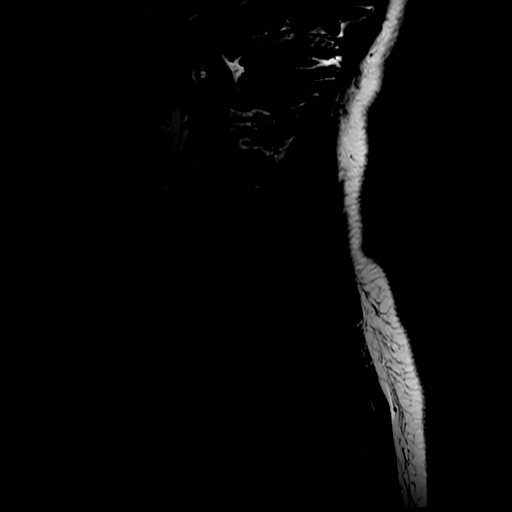

[Series 8: T2 · axial · 3.0mm · 0.35mm/px · z∈[-142,-31]mm · 6 of 38 slices shown (2 of 2)]
[im 1/38]
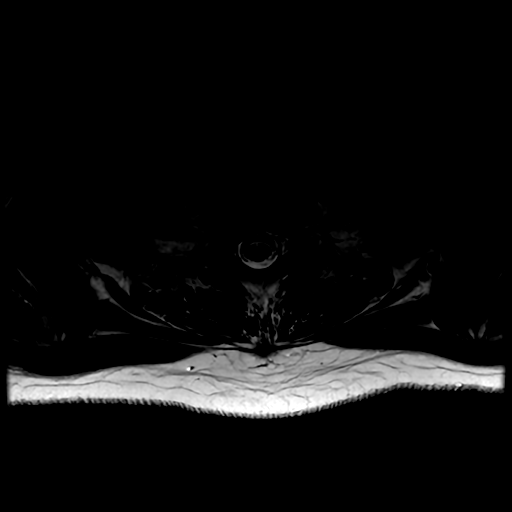
[im 8/38]
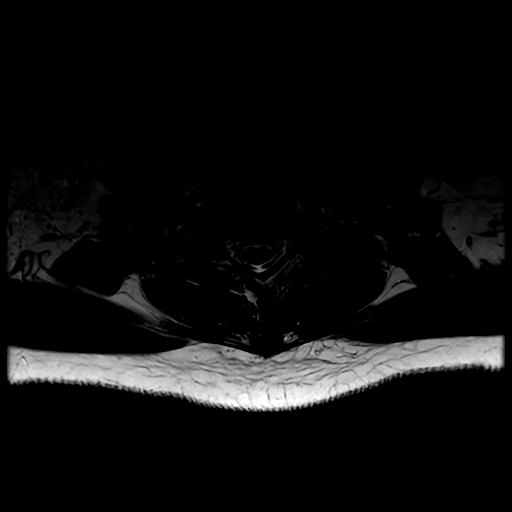
[im 15/38]
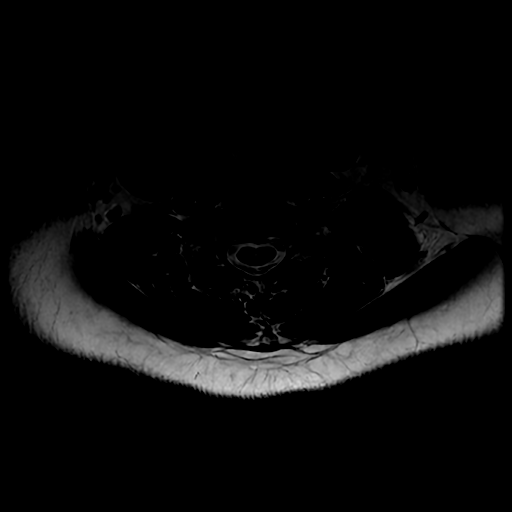
[im 23/38]
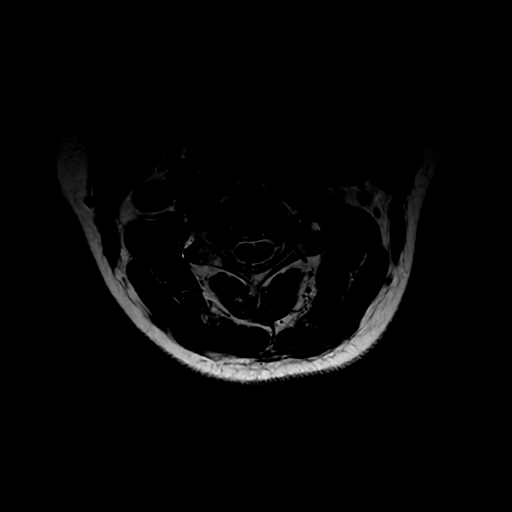
[im 30/38]
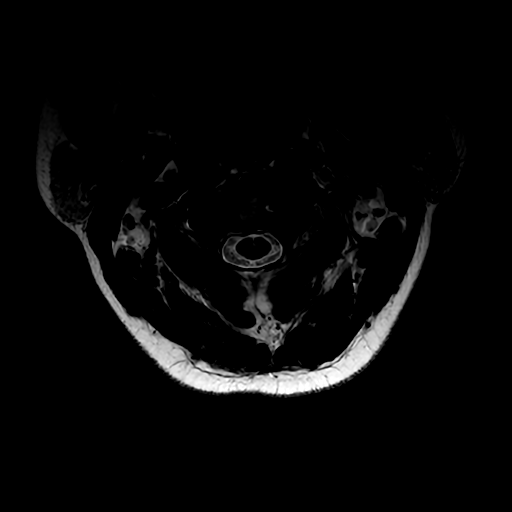
[im 38/38]
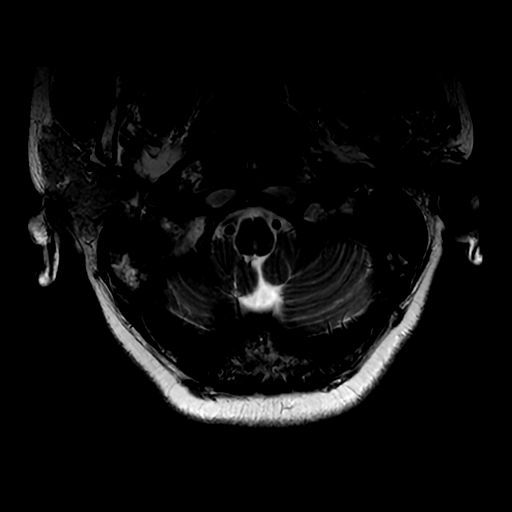

[Series 9: T1 · axial · non-contrast · 3.0mm · 0.35mm/px · z∈[-142,-31]mm · 6 of 38 slices shown]
[im 1/38]
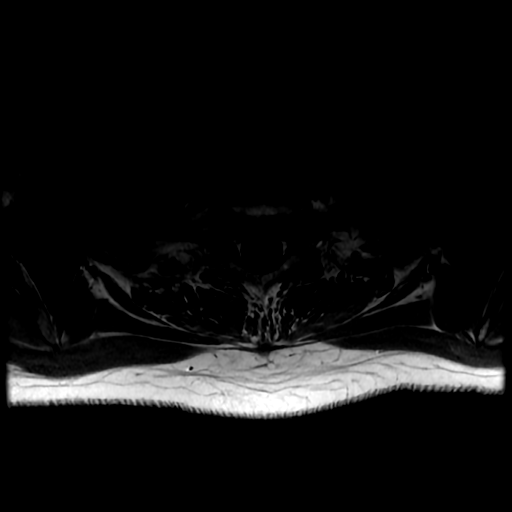
[im 8/38]
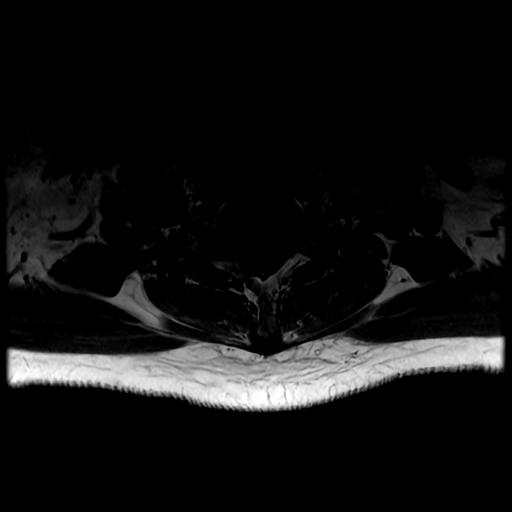
[im 15/38]
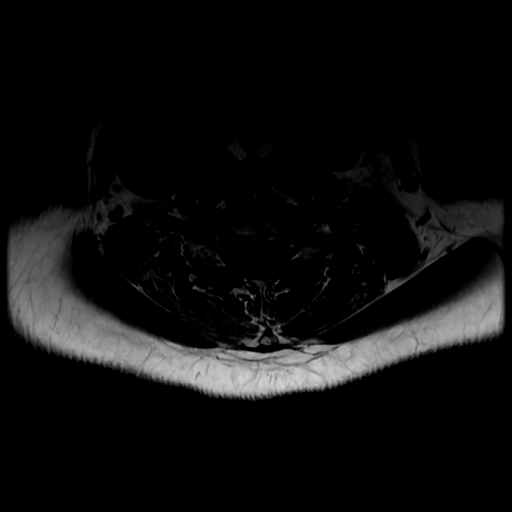
[im 23/38]
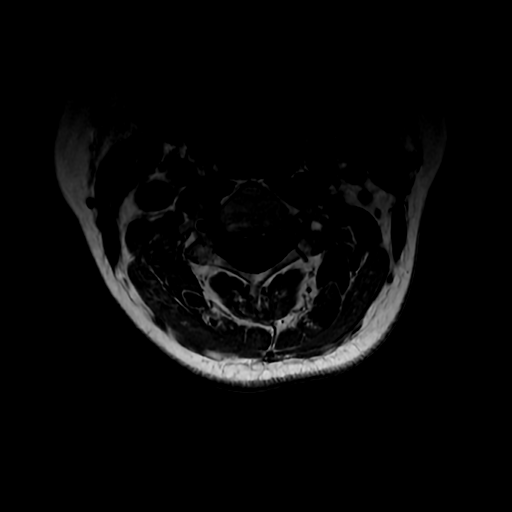
[im 30/38]
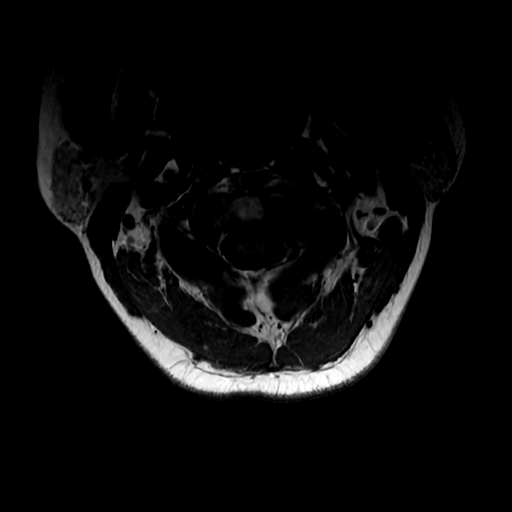
[im 38/38]
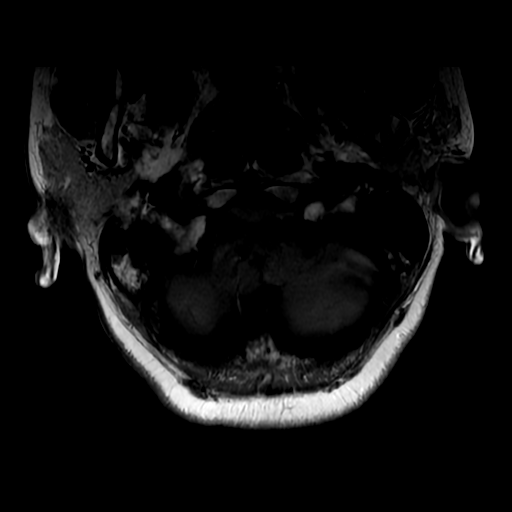

[Series 10: T1 fat-sat post-contrast · sagittal · 3.0mm · 0.43mm/px · 3 of 18 slices shown]
[im 1/18]
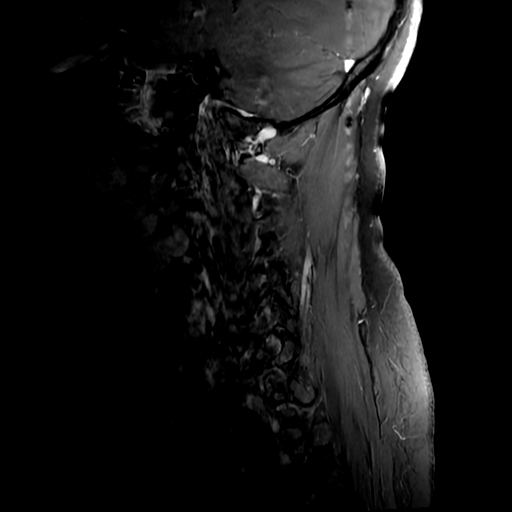
[im 9/18]
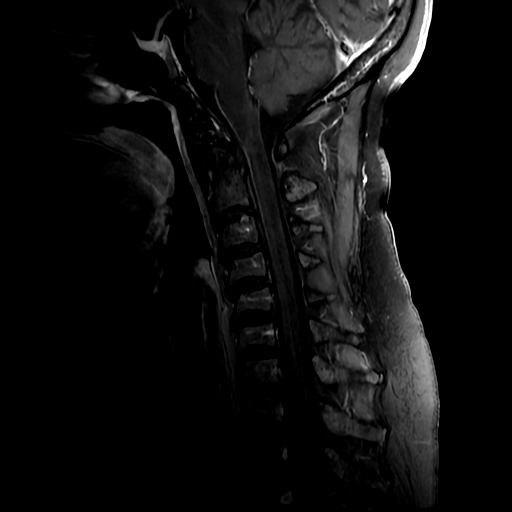
[im 18/18]
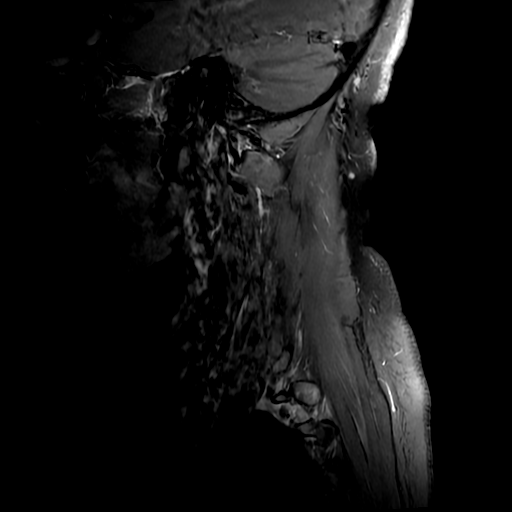

[19 of 48 positions shown; findings below may reference images not displayed]

FINDINGS: Alignment:

Vertebrae: Vertebral body heights are maintained. No specific
evidence of acute fracture, discitis/osteomyelitis, or suspicious
bone lesion. No abnormal enhancement.

Cord: Limited sagittal STIR sequence due to artifact. No evidence of
abnormal cord signal on the other sequences, including T2. No
syrinx. No abnormal enhancement.

Posterior Fossa, vertebral arteries, paraspinal tissues: Negative.

Disc levels:

C2-C3: No significant disc protrusion, foraminal stenosis, or canal
stenosis.

C3-C4: Right greater than left uncovertebral hypertrophy with small
posterior disc osteophyte complex. Resulting moderate right
foraminal stenosis and mild canal stenosis. No significant left
foraminal stenosis.

C4-C5: Left greater than right uncovertebral hypertrophy. Mild left
foraminal stenosis. No significant canal stenosis.

C5-C6: Small central disc protrusion which effaces ventral CSF.
Bilateral uncovertebral hypertrophy. No significant canal or
foraminal stenosis.

C6-C7: Right greater than left uncovertebral hypertrophy. Mild right
foraminal stenosis. No significant canal stenosis.

C7-T1: No significant disc protrusion, foraminal stenosis, or canal
stenosis.
IMPRESSION: 1. No evidence of abnormal cord signal or enhancement.
2. Moderate foraminal stenosis on the right at C3-C4. Mild foraminal
stenosis on the left at C4-C5 and the right at C6-C7.

## 2020-11-11 MED ORDER — DIPHENHYDRAMINE HCL 50 MG/ML IJ SOLN
12.5000 mg | Freq: Once | INTRAMUSCULAR | Status: AC
  Administered 2020-11-11: 12.5 mg via INTRAVENOUS
  Filled 2020-11-11: qty 1

## 2020-11-11 MED ORDER — METHOCARBAMOL 500 MG PO TABS
500.0000 mg | ORAL_TABLET | Freq: Four times a day (QID) | ORAL | Status: DC | PRN
  Administered 2020-11-16: 500 mg via ORAL
  Filled 2020-11-11: qty 1

## 2020-11-11 MED ORDER — ACETAMINOPHEN 650 MG RE SUPP: 650.0000 mg | Freq: Four times a day (QID) | RECTAL | Status: DC | PRN

## 2020-11-11 MED ORDER — METOCLOPRAMIDE HCL 5 MG/ML IJ SOLN
10.0000 mg | Freq: Once | INTRAMUSCULAR | Status: AC
  Administered 2020-11-11: 10 mg via INTRAVENOUS
  Filled 2020-11-11: qty 2

## 2020-11-11 MED ORDER — GADOBUTROL 1 MMOL/ML IV SOLN
8.0000 mL | Freq: Once | INTRAVENOUS | Status: AC | PRN
  Administered 2020-11-11: 8 mL via INTRAVENOUS

## 2020-11-11 MED ORDER — ACETAMINOPHEN 325 MG PO TABS
650.0000 mg | ORAL_TABLET | Freq: Four times a day (QID) | ORAL | Status: DC | PRN
  Administered 2020-11-13 – 2020-11-16 (×7): 650 mg via ORAL
  Filled 2020-11-11 (×8): qty 2

## 2020-11-11 MED ORDER — PROMETHAZINE HCL 12.5 MG PO TABS
12.5000 mg | ORAL_TABLET | Freq: Four times a day (QID) | ORAL | Status: DC | PRN
  Filled 2020-11-11: qty 1

## 2020-11-11 NOTE — ED Provider Notes (Signed)
[facility]    CSN: [id number] Arrival date & time: 11/11/20  9476      History   Chief Complaint Chief Complaint  Patient presents with  . Headache  . Dizziness  . Muscle Pain    HPI Veronica [name] is a 55 y.o. female.   HPI   Headache: Pt reports that for the past 3-4 days she has had a headache, dizziness and muscle pains. No trauma, or cold symptoms.  No known fevers but has had chills.  She states that she has had to use her glasses occasionally but denies any significant visual changes.  She states that from her head down to her toes hurts along her spine and worsens with movements of her neck and spine but also does hurt at rest. Headache is mainly posterior in nature but does wrap around to the front of the head. She feels that the pain is "internal "and does not suspect that this is muscle related.  She has not tried anything for symptoms other than her current medications. No known sick contacts.    Past Medical History:  Diagnosis Date  . Hypertension     There are no problems to display for this patient.   History reviewed. No pertinent surgical history.  OB History   No obstetric history on file.      Home Medications    Prior to Admission medications   Medication Sig Start Date End Date Taking? Authorizing Provider  amLODipine (NORVASC) 10 MG tablet Take 10 mg by mouth daily. 10/30/20   [provider]  gabapentin (NEURONTIN) 300 MG capsule Take 300 mg by mouth 3 (three) times daily. 11/03/20   [provider]    Family History Family History  Family history unknown: Yes    Social History Social History   Tobacco Use  . Smoking status: Never Smoker  . Smokeless tobacco: Never Used  Substance Use Topics  . Alcohol use: Not Currently     Allergies   Penicillins   Review of Systems Review of Systems  As stated above in HPI Physical Exam Triage Vital Signs ED Triage Vitals  Enc Vitals Group     BP  11/11/20 0840 127/88     Pulse Rate 11/11/20 0840 85     Resp 11/11/20 0840 16     Temp 11/11/20 0840 98.4 F (36.9 C)     Temp Source 11/11/20 0840 Oral     SpO2 11/11/20 0840 96 %     Weight --      Height --      Head Circumference --      Peak Flow --      Pain Score 11/11/20 0838 5     Pain Loc --      Pain Edu? --      Excl. in [place]? --    No data found.  Updated Vital Signs BP 127/88 (BP Location: Left Arm)   Pulse 85   Temp 98.4 F (36.9 C) (Oral)   Resp 16   SpO2 96%   Physical Exam Vitals and nursing note reviewed.  Constitutional:      General: She is not in acute distress.    Appearance: She is not ill-appearing, toxic-appearing or diaphoretic.  HENT:     Head: Normocephalic and atraumatic.     Mouth/Throat:     Mouth: Mucous membranes are moist.  Eyes:     Extraocular Movements: Extraocular movements intact.     Right eye:  Normal extraocular motion and no nystagmus.     Left eye: Normal extraocular motion and no nystagmus.     Pupils: Pupils are equal, round, and reactive to light.     Right eye: Pupil is round and reactive.     Left eye: Pupil is round and reactive.  Neck:     Meningeal: Kernig's sign present.  Cardiovascular:     Rate and Rhythm: Normal rate and regular rhythm.  Pulmonary:     Effort: Pulmonary effort is normal.     Breath sounds: Normal breath sounds.  Musculoskeletal:        General: Normal range of motion.     Cervical back: Rigidity present.  Lymphadenopathy:     Cervical: No cervical adenopathy.  Skin:    General: Skin is warm.  Neurological:     Mental Status: She is alert and oriented to person, place, and time.     Cranial Nerves: No cranial nerve deficit or facial asymmetry.     Motor: No weakness.     Coordination: Coordination normal.     Gait: Gait normal.     Deep Tendon Reflexes: Reflexes normal.      UC Treatments / Results  Labs (all labs ordered are listed, but only abnormal results are  displayed) Labs Reviewed - No data to display  EKG   Radiology No results found.  Procedures Procedures (including critical care time)  Medications Ordered in UC Medications - No data to display  Initial Impression / Assessment and Plan / UC Course  I have reviewed the triage vital signs and the nursing notes.  Pertinent labs & imaging results that were available during my care of the patient were reviewed by me and considered in my medical decision making (see chart for details).     New.  Concern for potential meningitis or increased intracranial pressure given her symptoms.  I discussed this with patient.  I have recommended that she be evaluated in the emergency room right away.  She is agreeable to our plan.   Final Clinical Impressions(s) / UC Diagnoses   Final diagnoses:  None   Discharge Instructions   None    ED Prescriptions    None     PDMP not reviewed this encounter.   Hughie Closs, [place] 11/11/20 [phone]

## 2020-11-11 NOTE — ED Triage Notes (Signed)
Pt states headache started Saturday. Slight dizziness with some tingling in her hands.Tylenol not helping.

## 2020-11-11 NOTE — ED Notes (Signed)
Attempted report x1. 

## 2020-11-11 NOTE — ED Provider Notes (Signed)
[facility] EMERGENCY DEPARTMENT Provider Note   CSN: [id number] Arrival date & time: 11/11/20  6195     History Chief Complaint  Patient presents with  . Dizziness  . Headache    Veronica [name] is a 55 y.o. female presenting to the ED with headache and paresthesias.  Patient reports onset of symptoms 3-4 days ago with pain in her upper neck and the back of her head.  It has been constant for 4 days, not severe.  Tried tylenol only once with minimal relief. She reports a feeling of "heaviness" in her bilateral lower legs with the onset of her symptoms, and also reports a feeling like "I have ice on my hands" in the bilateral palms.  This has never happened to her before.  She denies hx of HIV, MS, or autoimmune disease.  She has not been out of the country for nearly 3 years.    Nothing makes her headache better or worse.  It is not positional.  She's never had these symptoms before.  Denies hx of migraines or headaches.  Her only medical problem is HTN, for which she takes amlodipine 10 mg nightly.    She was seen today at [facility] and referred into ED for further evaluation.  She has had 3 doses of the covid vaccine.  Denies cough, congestion, nausea, vomiting, diarrhea, sore throat.  Denies photophobia or blurred vision.    HPI     Past Medical History:  Diagnosis Date  . Hypertension     Patient Active Problem List   Diagnosis Date Noted  . Headache 11/11/2020  . Proximal muscle weakness 11/11/2020  . Essential hypertension 11/11/2020    History reviewed. No pertinent surgical history.   OB History   No obstetric history on file.     Family History  Family history unknown: Yes    Social History   Tobacco Use  . Smoking status: Never Smoker  . Smokeless tobacco: Never Used  Substance Use Topics  . Alcohol use: Not Currently    Home Medications Prior to Admission medications   Medication Sig Start Date End Date Taking? Authorizing Provider   amLODipine (NORVASC) 10 MG tablet Take 10 mg by mouth every evening. 10/30/20  Yes [provider]  gabapentin (NEURONTIN) 300 MG capsule Take 600 mg by mouth every evening. 11/03/20  Yes [provider]    Allergies    Penicillins  Review of Systems   Review of Systems  Constitutional: Negative for chills and fever.  Eyes: Negative for photophobia, pain, redness and visual disturbance.  Respiratory: Negative for cough and shortness of breath.   Cardiovascular: Negative for chest pain and palpitations.  Gastrointestinal: Negative for abdominal pain and vomiting.  Genitourinary: Negative for dysuria and hematuria.  Musculoskeletal: Positive for neck pain. Negative for arthralgias.  Skin: Negative for color change and rash.  Neurological: Positive for weakness, numbness and headaches. Negative for seizures, syncope, facial asymmetry and speech difficulty.  All other systems reviewed and are negative.   Physical Exam Updated Vital Signs BP (!) 155/101   Pulse 84   Temp 98 F (36.7 C) (Oral)   Resp 12   SpO2 100%   Physical Exam Constitutional:      General: She is not in acute distress. HENT:     Head: Normocephalic and atraumatic.  Eyes:     Extraocular Movements: Extraocular movements intact.     Conjunctiva/sclera: Conjunctivae normal.     Pupils: Pupils are equal, round,  and reactive to light.     Comments: No photophobia  Neck:     Meningeal: Brudzinski's sign and Kernig's sign absent.  Cardiovascular:     Rate and Rhythm: Normal rate and regular rhythm.  Pulmonary:     Effort: Pulmonary effort is normal. No respiratory distress.  Abdominal:     General: There is no distension.     Tenderness: There is no abdominal tenderness.  Musculoskeletal:     Cervical back: Normal range of motion and neck supple. No rigidity.  Lymphadenopathy:     Cervical: No cervical adenopathy.  Skin:    General: Skin is warm and dry.  Neurological:     General: No  focal deficit present.     Mental Status: She is alert. Mental status is at baseline.     GCS: GCS eye subscore is 4. GCS verbal subscore is 5. GCS motor subscore is 6.     Cranial Nerves: No cranial nerve deficit, dysarthria or facial asymmetry.     Comments: Paresthesias reported in bilateral hands Some possible weakness with bilateral hip flexion  Psychiatric:        Mood and Affect: Mood normal.        Behavior: Behavior normal.     ED Results / Procedures / Treatments   Labs (all labs ordered are listed, but only abnormal results are displayed) Labs Reviewed  BASIC METABOLIC PANEL - Abnormal; Notable for the following components:      Result Value   Glucose, Bld 107 (*)    All other components within normal limits  CBC WITH DIFFERENTIAL/PLATELET - Abnormal; Notable for the following components:   Platelets 478 (*)    All other components within normal limits  CSF CULTURE W GRAM STAIN  MAGNESIUM  CK  PROTEIN AND GLUCOSE, CSF  CSF CELL COUNT WITH DIFFERENTIAL  CSF CELL COUNT WITH DIFFERENTIAL  CRYPTOCOCCAL ANTIGEN, CSF  OLIGOCLONAL BANDS, CSF + SERM  DRAW EXTRA CLOT TUBE  IGG CSF INDEX  DRAW EXTRA CLOT TUBE  B. BURGDORFI ANTIBODIES  ANTIEXTRACTABLE NUCLEAR AG  ANTI-DNA ANTIBODY, DOUBLE-STRANDED  VITAMIN B12  PROTEIN ELECTROPHORESIS, SERUM  HIV ANTIBODY (ROUTINE TESTING W REFLEX)  COMPREHENSIVE METABOLIC PANEL  CBC  POC SARS CORONAVIRUS 2 AG -  ED    EKG EKG Interpretation  Date/Time:  Friday November 11 2020 09:33:00 EDT Ventricular Rate:  83 PR Interval:  164 QRS Duration: 86 QT Interval:  372 QTC Calculation: 438 R Axis:   138 Text Interpretation: Right and left arm electrode reversal, interpretation assumes no reversal Sinus rhythm Probable lateral infarct, age indeterminate No STEMI Confirmed by Octaviano Glow [phone]) on 11/11/2020 9:34:15 AM   Radiology MR CERVICAL SPINE W WO CONTRAST  Result Date: 11/11/2020 CLINICAL DATA:  Evaluate for demyelinating  disease. EXAM: MRI CERVICAL SPINE WITHOUT AND WITH CONTRAST TECHNIQUE: Multiplanar and multiecho pulse sequences of the cervical spine, to include the craniocervical junction and cervicothoracic junction, were obtained without and with intravenous contrast. CONTRAST:  36mL GADAVIST GADOBUTROL 1 MMOL/ML IV SOLN COMPARISON:  None. FINDINGS: Alignment: Vertebrae: Vertebral body heights are maintained. No specific evidence of acute fracture, discitis/osteomyelitis, or suspicious bone lesion. No abnormal enhancement. Cord: Limited sagittal STIR sequence due to artifact. No evidence of abnormal cord signal on the other sequences, including T2. No syrinx. No abnormal enhancement. Posterior Fossa, vertebral arteries, paraspinal tissues: Negative. Disc levels: C2-C3: No significant disc protrusion, foraminal stenosis, or canal stenosis. C3-C4: Right greater than left uncovertebral hypertrophy with small posterior disc osteophyte  complex. Resulting moderate right foraminal stenosis and mild canal stenosis. No significant left foraminal stenosis. C4-C5: Left greater than right uncovertebral hypertrophy. Mild left foraminal stenosis. No significant canal stenosis. C5-C6: Small central disc protrusion which effaces ventral CSF. Bilateral uncovertebral hypertrophy. No significant canal or foraminal stenosis. C6-C7: Right greater than left uncovertebral hypertrophy. Mild right foraminal stenosis. No significant canal stenosis. C7-T1: No significant disc protrusion, foraminal stenosis, or canal stenosis. IMPRESSION: 1. No evidence of abnormal cord signal or enhancement. 2. 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 Mild foraminal stenosis on the left at C4-C5 and the right at C6-C7. Electronically Signed   By: Margaretha Sheffield MD   On: 11/11/2020 14:17    Procedures .Lumbar Puncture  Date/Time: 11/11/2020 5:50 PM Performed by: Wyvonnia Dusky, MD Authorized by: Wyvonnia Dusky, MD   Consent:    Consent  obtained:  Verbal   Consent given by:  Patient   Risks, benefits, and alternatives were discussed: yes     Risks discussed:  Bleeding, headache, infection, nerve damage, pain and repeat procedure   Alternatives discussed:  Delayed treatment and observation Universal protocol:    Procedure explained and questions answered to patient or proxy's satisfaction: yes     Relevant documents present and verified: yes     Test results available: yes     Required blood products, implants, devices, and special equipment available: yes     Immediately prior to procedure a time out was called: yes     Site/side marked: yes     Patient identity confirmed:  Arm band and provided demographic data Pre-procedure details:    Procedure purpose:  Diagnostic   Preparation: Patient was prepped and draped in usual sterile fashion   Anesthesia:    Anesthesia method:  Local infiltration   Local anesthetic:  Lidocaine 1% w/o epi Procedure details:    Lumbar space:  L4-L5 interspace   Patient position:  Sitting   Needle gauge:  22   Ultrasound guidance: no     Number of attempts:  3 Post-procedure details:    Puncture site:  Direct pressure applied and adhesive bandage applied   Procedure completion:  Tolerated well, no immediate complications Comments:     Unsuccessful LP - unable to introduce needle into spinal sac.     Medications Ordered in ED Medications  acetaminophen (TYLENOL) tablet 650 mg (has no administration in time range)    Or  acetaminophen (TYLENOL) suppository 650 mg (has no administration in time range)  promethazine (PHENERGAN) tablet 12.5 mg (has no administration in time range)  methocarbamol (ROBAXIN) tablet 500 mg (has no administration in time range)  gadobutrol (GADAVIST) 1 MMOL/ML injection 8 mL (8 mLs Intravenous Contrast Given 11/11/20 1352)  metoCLOPramide (REGLAN) injection 10 mg (10 mg Intravenous Given 11/11/20 1730)  diphenhydrAMINE (BENADRYL) injection 12.5 mg (12.5 mg  Intravenous Given 11/11/20 1730)    ED Course  I have reviewed the triage vital signs and the nursing notes.  Pertinent labs & imaging results that were available during my care of the patient were reviewed by me and considered in my medical decision making (see chart for details).  This patient complains of headache, paresthesias.  This involves an extensive number of treatment options, and is a complaint that carries with it a high risk of complications and morbidity.  The differential diagnosis includes infection including covid vs metabolic derangement vs CNS lesion vs new onset MS vs other  She is extremely well appearing clinically.  After 3-4 days of symptoms, she does not display signs of toxicity or meningismus on my exam to suggest bacterial meningitis.    No prior personal or family hx of MS.  Her age would be an atypical onset for MS.  However, I wonder about the possibility of a C-spine lesion given the location of her neck pain and her lower extremity symptoms.  Also possible would be GBS, myositis, or other condition   I ordered, reviewed, and interpreted labs.  No life-threatening abnormalities were noted on these tests.  WBC 4.5.  Again, lower suspicion for bacterial meningitis at this time. I ordered medication IV reglan, IV benadryl for headache. I ordered imaging studies which included MR C-spine.  [place]arrison per radiology request.  Additional MR imaging ordered by neurology. I independently visualized and interpreted imaging which showed no life-threatening abnormalities, and the monitor tracing which showed NSR ECG personally reviewed showing NSR with no acute ischemic findings  Neurology consulted as noted below, case discussed - recommending further workup and observation admission for GBS evaluation.       Clinical Course as of 11/11/20 1751  Fri Nov 11, 2020  8676 I spoke to Dr Lorrin Goodell from neurology who will evaluate the patient and give further recommendations. [MT]   1950 WBC: 4.5 [MT]  9326 Labs are unremarkable - awaiting neurology evaluation [MT]  1254 Neurologist recommending an LP.  Patient consented for procedure.   [MT]  1310 Pt taken to MRI now, will obtain LP upon return [MT]  1530 Unsuccesful LP [MT]  1549 I spoke to Dr Posey Pronto from radiology (fluoro) who states his team can attempt the LP - order placed.   [MT]  [id number] Radiologist requesting a head CT prior to performing the LP.  This has been ordered.  I spoke to our neurologist again who is recommending medical admission for observation pending CSF studies, LP results - concern for GBS vs myositis [MT]  1629 Signed out to [facility]ad hospitalist for obs admission.  Patient updated regarding entire plan and in agreement with admission and workup [MT]    Clinical Course User Index [MT] Coulson Wehner, Carola Rhine, MD    Final Clinical Impression(s) / ED Diagnoses Final diagnoses:  Neck pain  Headache    Rx / DC Orders ED Discharge Orders    None       Wyvonnia Dusky, MD 11/11/20 1751

## 2020-11-11 NOTE — ED Notes (Signed)
Attempted to call report x 1  

## 2020-11-11 NOTE — Consult Note (Signed)
NEUROLOGY CONSULTATION NOTE   Date of service: November 11, 2020 Patient Name: Veronica [name] MRN:  [id number] DOB:  [birth year] Reason for consult: "headache, weakness, muscle pain" _ _ _   _ __   _ __ _ _  __ __   _ __   __ _  History of Present Illness  Veronica [name] is a 55 y.o. female with PMH significant for  has a past medical history of Hypertension. who presents with  Headache, dizziness, and muscle pain. She reports that she was in her normal state of health until Saturday. She states she started having a headache with some weakness. But her symptoms were mild at that time but they continued to get worse. Then 3-4 days ago she started having some pins and needles sensation in her hands and feet. She reports that her legs felt heavier and heavier and she has had trouble walking up the stairs to her apartment.. She reports that this morning her headache was worse and she had pain from the top of her head down her spine hurt so much she came to the ED.  She reports that she has had no fevers, chills, cough, GI symptoms in the last 3 months. She reports no recent periods of time outside. She reports she received her flu and COVID vaccine about 3 months ago. She reports the last time she left the country was in 2019. When asked about her diet she reports that she does not eat red meat often.   ROS   Constitutional Denies weight loss, fever and chills.   HEENT Denies changes in vision and hearing.   Respiratory Denies SOB and cough.   CV Denies palpitations and CP   GI Denies abdominal pain, nausea, vomiting and diarrhea.   GU Denies dysuria and urinary frequency.   MSK Reports pain running down her spine and muscle pain  Skin Denies rash and pruritus.   Neurological Reports headache  Psychiatric Denies recent changes in mood. Denies anxiety and depression.    Past History   Past Medical History:  Diagnosis Date  . Hypertension    History reviewed. No pertinent surgical history. Family  History  Family history unknown: Yes   Social History   Socioeconomic History  . Marital status: Single    Spouse name: Not on file  . Number of children: Not on file  . Years of education: Not on file  . Highest education level: Not on file  Occupational History  . Not on file  Tobacco Use  . Smoking status: Never Smoker  . Smokeless tobacco: Never Used  Substance and Sexual Activity  . Alcohol use: Not Currently  . Drug use: Not on file  . Sexual activity: Not on file  Other Topics Concern  . Not on file  Social History Narrative  . Not on file   Social Determinants of Health   Financial Resource Strain: Not on file  Food Insecurity: Not on file  Transportation Needs: Not on file  Physical Activity: Not on file  Stress: Not on file  Social Connections: Not on file   Allergies  Allergen Reactions  . Penicillins Itching and Swelling    Medications  (Not in a hospital admission)    Vitals   Vitals:   11/11/20 1015 11/11/20 1100 11/11/20 1115 11/11/20 1130  BP: (!) 154/102 (!) 157/97 (!) 155/98 (!) 154/102  Pulse: 77 70 67 83  Resp: '15 11 11 ' (!) 26  Temp:  TempSrc:      SpO2: 99% 100% 99% 100%     There is no height or weight on file to calculate BMI.  Physical Exam   General: Laying comfortably in bed; in no acute distress.  HENT: Normal oropharynx and mucosa. Normal external appearance of ears and nose.  Neck: Supple, no pain or tenderness to palpation but pain with movement CV: No JVD. No peripheral edema.  Pulmonary: Symmetric Chest rise. Normal respiratory effort.  Abdomen: Soft to touch, non-tender.  Ext: No cyanosis, edema, or deformity  Skin: No rash. Normal palpation of skin.   Musculoskeletal: Normal digits and nails by inspection. No clubbing.   Neurologic Examination  Mental status/Cognition: Alert, oriented to self, place, month and year, good attention.  Speech/language: Fluent, comprehension intact, object naming intact,  repetition intact.  Cranial nerves:   CN II Pupils equal and reactive to light, no VF deficits    CN III,IV,VI EOM intact, no gaze preference or deviation, no nystagmus    CN V normal sensation in V1, V2, and V3 segments bilaterally    CN VII no asymmetry, no nasolabial fold flattening    CN VIII normal hearing to speech    CN IX & X normal palatal elevation, no uvular deviation    CN XI 5/5 head turn and 5/5 shoulder shrug bilaterally    CN XII midline tongue protrusion    Motor:  Muscle bulk: normal, tone normal, no pronator drift or tremor  Mvmt Root Nerve  Muscle Right Left Comments  SA C5/6 Ax Deltoid 4 4   EF C5/6 Mc Biceps 4+ 4+   EE C6/7/8 Rad Triceps 4+ 4+   WF C6/7 Med FCR 5 5   WE C7/8 PIN ECU 5 5   F Ab C8/T1 U ADM/FDI 5 5   HF L1/2/3 Fem Illopsoas 4+ 4+   KE L2/3/4 Fem Quad 4+ 4+   DF L4/5 D Peron Tib Ant 5 5   PF S1/2 Tibial Grc/Sol 5 5    Reflexes:  Right Left Comments  Pectoralis      Biceps (C5/6) 2 2   Brachioradialis (C5/6) 2+ 2+    Triceps (C6/7) 2 2    Patellar (L3/4) 0 0    Achilles (S1) 0 0    Hoffman      Plantar mute mute   Jaw jerk    No response bilaterally for Babinski Sensation:  Light touch Intact bilaterally   Pin prick Intact bilaterally   Temperature    Vibration   Proprioception    Coordination/Complex Motor:  - Finger to Nose Intact bilaterally - Heel to shin Intact bilaterally - Rapid alternating movement Intact bilaterally - Gait: deferred  Labs   CBC:  Recent Labs  Lab 11/11/20 0945  WBC 4.5  NEUTROABS 2.8  HGB 12.9  HCT 39.1  MCV 84.1  PLT 478*    Basic Metabolic Panel:  Lab Results  Component Value Date   NA 140 11/11/2020   K 3.8 11/11/2020   CO2 24 11/11/2020   GLUCOSE 107 (H) 11/11/2020   BUN 6 11/11/2020   CREATININE 0.56 11/11/2020   CALCIUM 9.2 11/11/2020   GFRNONAA >60 11/11/2020   GFRAA  06/09/2010    >60        The eGFR has been calculated using the MDRD equation. This calculation has not  been validated in all clinical situations. eGFR's persistently <60 mL/min signify possible Chronic Kidney Disease.   Lipid Panel: No results found for:  LDLCALC HgbA1c: No results found for: HGBA1C Urine Drug Screen: No results found for: LABOPIA, COCAINSCRNUR, LABBENZ, AMPHETMU, THCU, LABBARB  Alcohol Level No results found for: [place]  CT Head without contrast: Not ordered  CT angio Head and Neck with contrast: Not ordered  MRI C spine with and without contrast: No syrinx, no cord compression.  MRI femur: - pending.  Impression   Veronica [name] is a 55 y.o. female with PMH significant for HTN who presents with 6-7 day hx of muscle weakness, proximal > distal and symmetric. Her neurologic examination is notable for acute symmetric progressive Proximal more than distal muscle weakness of all extremities over the last few days. In addition, also has subjective pins and needles in finger tips and toes x 3-4 days. She also has absent reflexes in BL lower extremities. Symptoms maybe concerning for myositis given she reports pain deep in her muscles  With mostly proximal muscle weakness vs potential GBS given rapid onset of weakness with absent reflexes and subjective paresthesias in fingers and toes.  Workup with MRI C spine to look for syrinx vs Transverse myelitis. Will order MRI femur for signs of muscle inflammation and draw autoantibody labs for signs of myositis. Will get LP to evaluate spinal fluid. Will draw B12 lab.  Recommendations  - I ordered MRI C spine W and W/O Contrast - I ordered MRI Left femur W and W/O Contrast - Bedside LP with CSF cell count, differential, protein, glucose, gram stain with Cx, Oligoclonal bands, IgG index. - Vit B12 - Serum SPEP - Serum CK - Serum Lyme Ab. ______________________________________________________________________   Thank you for the opportunity to take part in the care of this patient. If you have any further questions, please contact  the neurology consultation attending.  Signed,  [place] Pager Number [phone] _ _ _   _ __   _ __ _ _  __ __   _ __   __ _

## 2020-11-11 NOTE — H&P (Signed)
History and Physical    Veronica [name] HYQ:[id number] DOB: 1966/06/25 DOA: 11/11/2020  PCP: Merrilee Seashore, MD   Patient coming from: Home  Chief Complaint  Patient presents with  . Dizziness  . Headache      HPI: Veronica [name] is a 55 y.o. female with medical history significant for hypertension, chronic back pain. She comes to the ED for evaluation of headache and dizziness and muscle pain.Her symptoms started about 3 days ago.  Complains of mostly headache on the posterior side and on the vertex, c/o some numbness tingling in her hands and feet and feels tired her legs feel heavy. She has difficulty walking up the stairs.  She does have chronic back pain and takes Neurontin.  Denies any focal weakness fever chills nausea vomiting chest pain.  ED Course: Blood pressure on higher side mostly diastolic saturating well on room air, routine labs showed stable CBC, BMP, COVID-19 negative.  Neurology was consulted MRI cervical spine moderate foraminal stenosis, no evidence of abnormal cord signal or enhancement, MRI femur left was pending.  Multiple attempts down for LP but unsuccessful and fluoroscopic guided lumbar puncture has been ordered pending CT of the head.  Review of Systems: All systems were reviewed and were negative except as mentioned in HPI above. Negative for fever Negative for chest pain Negative for shortness of breath  Past Medical History:  Diagnosis Date  . Hypertension     History reviewed. No pertinent surgical history.   reports that she has never smoked. She has never used smokeless tobacco. She reports previous alcohol use. No history on file for drug use.  Allergies  Allergen Reactions  . Penicillins Itching and Swelling    Family History  Family history unknown: Yes  Patient reports father mother had no medical issues.   Prior to Admission medications   Medication Sig Start Date End Date Taking? Authorizing Provider  amLODipine (NORVASC) 10 MG  tablet Take 10 mg by mouth every evening. 10/30/20  Yes [provider]  gabapentin (NEURONTIN) 300 MG capsule Take 600 mg by mouth every evening. 11/03/20  Yes [provider]    Physical Exam: Vitals:   11/11/20 1130 11/11/20 1245 11/11/20 1300 11/11/20 1558  BP: (!) 154/102 (!) 154/94 (!) 166/98 (!) 148/108  Pulse: 83 79 69 82  Resp: (!) 26 18 12 14   Temp:      TempSrc:      SpO2: 100% 100% 98% 99%    General exam: AAOx3 , NAD, weak appearing. HEENT:Oral mucosa moist, Ear/Nose WNL grossly, dentition normal. Respiratory system: bilaterally clear,no wheezing or crackles,no use of accessory muscle Cardiovascular system: S1 & S2 +, No JVD,. Gastrointestinal system: Abdomen soft, NT,ND, BS+ Nervous System:Alert, awake, moving extremities and grossly nonfocal, some proximal muscle weakness Extremities: No edema, distal peripheral pulses palpable.  Skin: No rashes,no icterus. MSK: Normal muscle bulk,tone, power   Labs on Admission: I have personally reviewed following labs and imaging studies  CBC: Recent Labs  Lab 11/11/20 0945  WBC 4.5  NEUTROABS 2.8  HGB 12.9  HCT 39.1  MCV 84.1  PLT 952*   Basic Metabolic Panel: Recent Labs  Lab 11/11/20 0945  NA 140  K 3.8  CL 109  CO2 24  GLUCOSE 107*  BUN 6  CREATININE 0.56  CALCIUM 9.2  MG 2.0   GFR: CrCl cannot be calculated (Unknown ideal weight.). Liver Function Tests: No results for input(s): AST, ALT, ALKPHOS, BILITOT, PROT, ALBUMIN in the last 168 hours. No  results for input(s): LIPASE, AMYLASE in the last 168 hours. No results for input(s): AMMONIA in the last 168 hours. Coagulation Profile: No results for input(s): INR, PROTIME in the last 168 hours. Cardiac Enzymes: No results for input(s): CKTOTAL, CKMB, CKMBINDEX, TROPONINI in the last 168 hours. BNP (last 3 results) No results for input(s): PROBNP in the last 8760 hours. HbA1C: No results for input(s): HGBA1C in the last 72  hours. CBG: No results for input(s): GLUCAP in the last 168 hours. Lipid Profile: No results for input(s): CHOL, HDL, LDLCALC, TRIG, CHOLHDL, LDLDIRECT in the last 72 hours. Thyroid Function Tests: No results for input(s): TSH, T4TOTAL, FREET4, T3FREE, THYROIDAB in the last 72 hours. Anemia Panel: No results for input(s): VITAMINB12, FOLATE, FERRITIN, TIBC, IRON, RETICCTPCT in the last 72 hours. Urine analysis: No results found for: COLORURINE, APPEARANCEUR, LABSPEC, PHURINE, GLUCOSEU, HGBUR, BILIRUBINUR, KETONESUR, PROTEINUR, UROBILINOGEN, NITRITE, LEUKOCYTESUR  Radiological Exams on Admission: MR CERVICAL SPINE W WO CONTRAST  Result Date: 11/11/2020 CLINICAL DATA:  Evaluate for demyelinating disease. EXAM: MRI CERVICAL SPINE WITHOUT AND WITH CONTRAST TECHNIQUE: Multiplanar and multiecho pulse sequences of the cervical spine, to include the craniocervical junction and cervicothoracic junction, were obtained without and with intravenous contrast. CONTRAST:  33mL GADAVIST GADOBUTROL 1 MMOL/ML IV SOLN COMPARISON:  None. FINDINGS: Alignment: Vertebrae: Vertebral body heights are maintained. No specific evidence of acute fracture, discitis/osteomyelitis, or suspicious bone lesion. No abnormal enhancement. Cord: Limited sagittal STIR sequence due to artifact. No evidence of abnormal cord signal on the other sequences, including T2. No syrinx. No abnormal enhancement. Posterior Fossa, vertebral arteries, paraspinal tissues: Negative. Disc levels: C2-C3: No significant disc protrusion, foraminal stenosis, or canal stenosis. C3-C4: Right greater than left uncovertebral hypertrophy with small posterior disc osteophyte complex. Resulting moderate right foraminal stenosis and mild canal stenosis. No significant left foraminal stenosis. C4-C5: Left greater than right uncovertebral hypertrophy. Mild left foraminal stenosis. No significant canal stenosis. C5-C6: Small central disc protrusion which effaces ventral  CSF. Bilateral uncovertebral hypertrophy. No significant canal or foraminal stenosis. C6-C7: Right greater than left uncovertebral hypertrophy. Mild right foraminal stenosis. No significant canal stenosis. C7-T1: No significant disc protrusion, foraminal stenosis, or canal stenosis. IMPRESSION: 1. No evidence of abnormal cord signal or enhancement. 2. 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 Mild foraminal stenosis on the left at C4-C5 and the right at C6-C7. Electronically Signed   By: Margaretha Sheffield MD   On: 11/11/2020 14:17     Assessment/Plan Headache/numbness tingling sensation and heaviness in lower extremities: Differential includes GBS, myositis neuropathy. Neurology has been consulted and plan is to monitor overnight with LP which is pending, MRI femur pending if has evidence of myositis neuro planning to do further work-up with autoantibodies.  Check B12, CK level  SEROLOGY/Nrological work-up as per neurology.  Symptomatic management of headache.  Chronic back pain on Neurontin resumed  Hypertension blood pressure borderline controlled resume her amlodipine.  There is no height or weight on file to calculate BMI.   Severity of Illness: * I certify that at the point of admission it is my clinical judgment that the patient will require observation level of hospital care spanning less than 2 midnights from the point of admission due to high intensity of service, high risk for further deterioration and high frequency of surveillance required.    DVT prophylaxis: SCDs Start: 11/11/20 [id number] Code Status:   Code Status: Full Code  Family Communication: Admission, patients condition and plan of care including tests being ordered have  been discussed with the patient who indicate understanding and agree with the plan and Code Status.  Consults called:  Neurology by EDP  Antonieta Pert MD [facility]  If 7PM-7AM, please contact night-coverage [email or web address]  11/11/2020, 4:50  PM

## 2020-11-11 NOTE — ED Triage Notes (Signed)
Pt presents with headache, dizziness, and muscle pain xs 3-4 days.

## 2020-11-11 NOTE — Discharge Instructions (Addendum)
Please have someone drive you across the parking lot to the emergency room

## 2020-11-11 NOTE — ED Notes (Signed)
Pt transported to MRI 

## 2020-11-11 NOTE — ED Notes (Signed)
Report attempted x2

## 2020-11-12 ENCOUNTER — Observation Stay (HOSPITAL_COMMUNITY): Payer: BC Managed Care – PPO

## 2020-11-12 DIAGNOSIS — G053 Encephalitis and encephalomyelitis in diseases classified elsewhere: Secondary | ICD-10-CM | POA: Diagnosis present

## 2020-11-12 DIAGNOSIS — G629 Polyneuropathy, unspecified: Secondary | ICD-10-CM | POA: Diagnosis present

## 2020-11-12 DIAGNOSIS — Z79899 Other long term (current) drug therapy: Secondary | ICD-10-CM | POA: Diagnosis not present

## 2020-11-12 DIAGNOSIS — M359 Systemic involvement of connective tissue, unspecified: Secondary | ICD-10-CM | POA: Diagnosis present

## 2020-11-12 DIAGNOSIS — Z20822 Contact with and (suspected) exposure to covid-19: Secondary | ICD-10-CM | POA: Diagnosis present

## 2020-11-12 DIAGNOSIS — D472 Monoclonal gammopathy: Secondary | ICD-10-CM | POA: Diagnosis present

## 2020-11-12 DIAGNOSIS — G8929 Other chronic pain: Secondary | ICD-10-CM | POA: Diagnosis present

## 2020-11-12 DIAGNOSIS — R531 Weakness: Secondary | ICD-10-CM | POA: Diagnosis not present

## 2020-11-12 DIAGNOSIS — M542 Cervicalgia: Secondary | ICD-10-CM | POA: Diagnosis present

## 2020-11-12 DIAGNOSIS — G35 Multiple sclerosis: Secondary | ICD-10-CM | POA: Diagnosis not present

## 2020-11-12 DIAGNOSIS — G0481 Other encephalitis and encephalomyelitis: Secondary | ICD-10-CM | POA: Diagnosis present

## 2020-11-12 DIAGNOSIS — Z880 Allergy status to penicillin: Secondary | ICD-10-CM | POA: Diagnosis not present

## 2020-11-12 DIAGNOSIS — R519 Headache, unspecified: Secondary | ICD-10-CM | POA: Diagnosis not present

## 2020-11-12 DIAGNOSIS — R29810 Facial weakness: Secondary | ICD-10-CM | POA: Diagnosis not present

## 2020-11-12 DIAGNOSIS — G610 Guillain-Barre syndrome: Secondary | ICD-10-CM | POA: Diagnosis present

## 2020-11-12 DIAGNOSIS — G510 Bell's palsy: Secondary | ICD-10-CM | POA: Diagnosis present

## 2020-11-12 DIAGNOSIS — I10 Essential (primary) hypertension: Secondary | ICD-10-CM | POA: Diagnosis present

## 2020-11-12 DIAGNOSIS — M549 Dorsalgia, unspecified: Secondary | ICD-10-CM | POA: Diagnosis present

## 2020-11-12 LAB — PROTEIN, CSF: Total  Protein, CSF: 169 mg/dL — ABNORMAL HIGH (ref 15–45)

## 2020-11-12 LAB — COMPREHENSIVE METABOLIC PANEL
ALT: 28 U/L (ref 0–44)
AST: 29 U/L (ref 15–41)
Albumin: 4 g/dL (ref 3.5–5.0)
Alkaline Phosphatase: 77 U/L (ref 38–126)
Anion gap: 8 (ref 5–15)
BUN: 7 mg/dL (ref 6–20)
CO2: 25 mmol/L (ref 22–32)
Calcium: 9.5 mg/dL (ref 8.9–10.3)
Chloride: 108 mmol/L (ref 98–111)
Creatinine, Ser: 0.61 mg/dL (ref 0.44–1.00)
GFR, Estimated: >60 mL/min (ref >60)
Glucose, Bld: 97 mg/dL (ref 70–99)
Potassium: 3.4 mmol/L — ABNORMAL LOW (ref 3.5–5.1)
Sodium: 141 mmol/L (ref 135–145)
Total Bilirubin: 0.9 mg/dL (ref 0.3–1.2)
Total Protein: 7 g/dL (ref 6.5–8.1)

## 2020-11-12 LAB — CBC
HCT: 40 % (ref 36.0–46.0)
Hemoglobin: 13.4 g/dL (ref 12.0–15.0)
MCH: 28.1 pg (ref 26.0–34.0)
MCHC: 33.5 g/dL (ref 30.0–36.0)
MCV: 83.9 fL (ref 80.0–100.0)
Platelets: 468 10*3/uL — ABNORMAL HIGH (ref 150–400)
RBC: 4.77 MIL/uL (ref 3.87–5.11)
RDW: 13.2 % (ref 11.5–15.5)
WBC: 4.9 10*3/uL (ref 4.0–10.5)
nRBC: 0 % (ref 0.0–0.2)

## 2020-11-12 LAB — CSF CELL COUNT WITH DIFFERENTIAL
RBC Count, CSF: 243 /mm3 — ABNORMAL HIGH
Tube #: 3
WBC, CSF: 2 /mm3 (ref 0–5)

## 2020-11-12 LAB — CRYPTOCOCCAL ANTIGEN, CSF: Crypto Ag: NEGATIVE

## 2020-11-12 LAB — GLUCOSE, CSF: Glucose, CSF: 55 mg/dL (ref 40–70)

## 2020-11-12 IMAGING — RF DG SPINAL PUNCT LUMBAR DIAG WITH FL CT GUIDANCE
7 series · 7 of 7 positions shown · non-contrast
Comparison: Lumbar spine MRI-[DATE]

INDICATION: Headache and neck pain. Failed attempted bedside lumbar puncture.
Please perform image guided lumbar puncture for diagnostic purposes.

EXAM:
FLUOROSCOPIC GUIDED LUMBAR PUNCTURE WITH OPENING AND CLOSING
PRESSURES
TECHNIQUE: The patient was initially initiated by Dr. MIMY ORANGE however as
it difficulty performing the lumbar puncture and as such I was
requested to complete the procedure.

[Series 1: cp_standard · 0.17mm/px · 1 of 1 slices shown (1 of 6)]
[im 1/1]
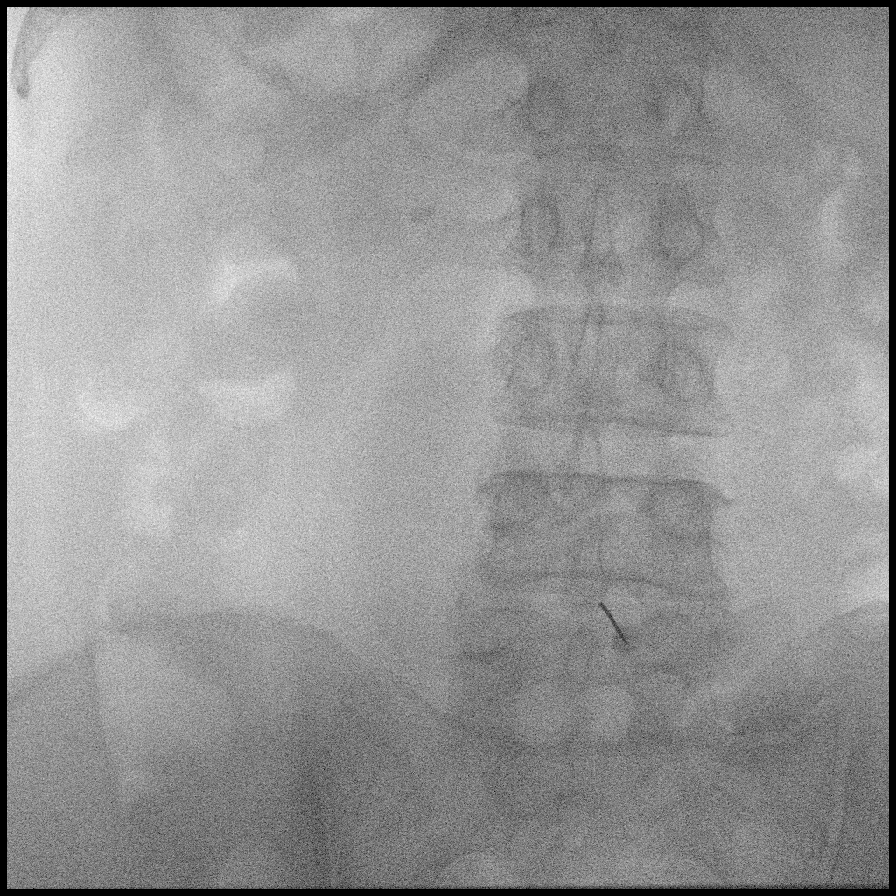

[Series 2: cp_standard · 0.17mm/px · 1 of 1 slices shown (2 of 6)]
[im 1/1]
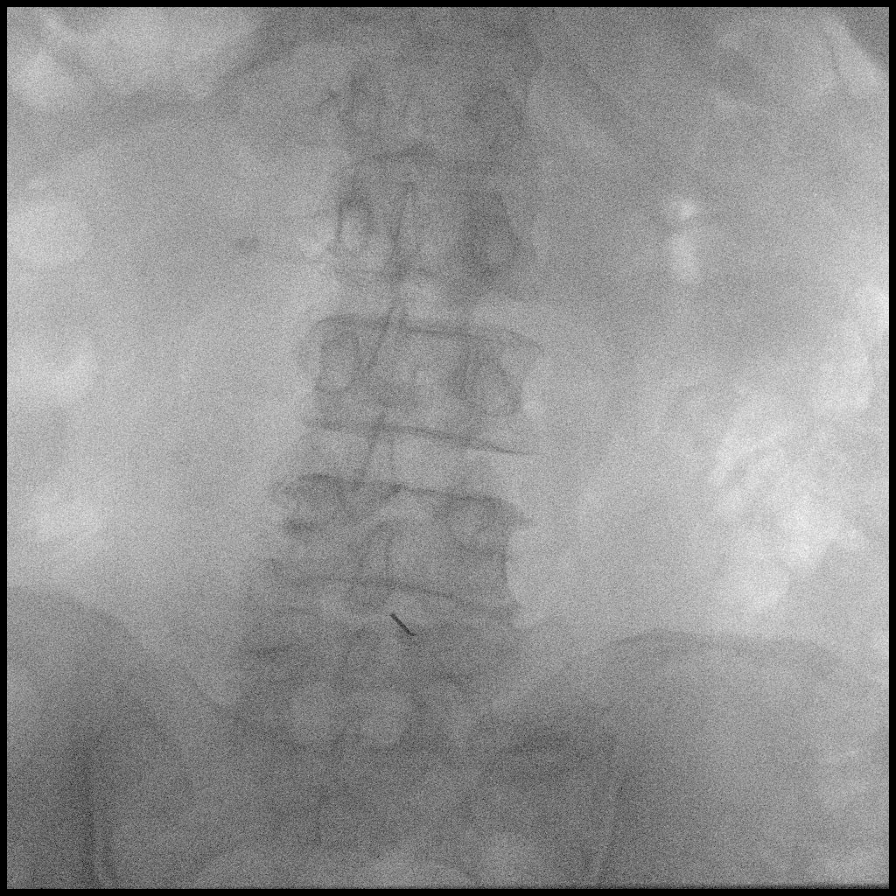

[Series 3: cp_standard · 0.17mm/px · 1 of 1 slices shown (3 of 6)]
[im 1/1]
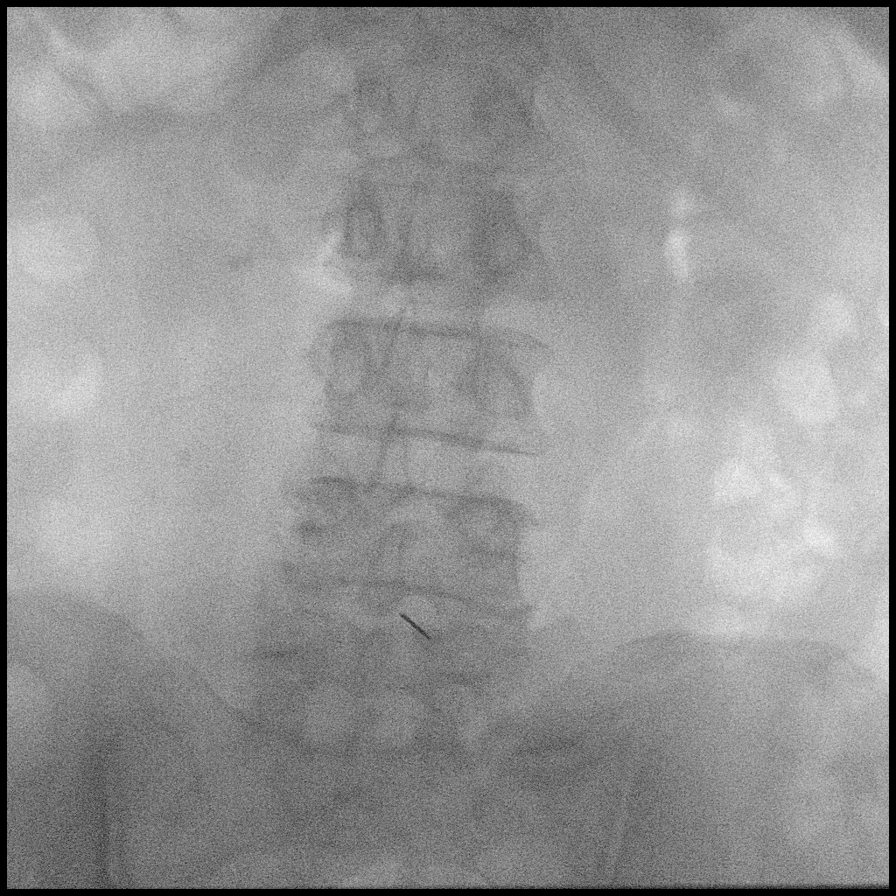

[Series 4: cp_standard · 0.17mm/px · 1 of 1 slices shown (4 of 6)]
[im 1/1]
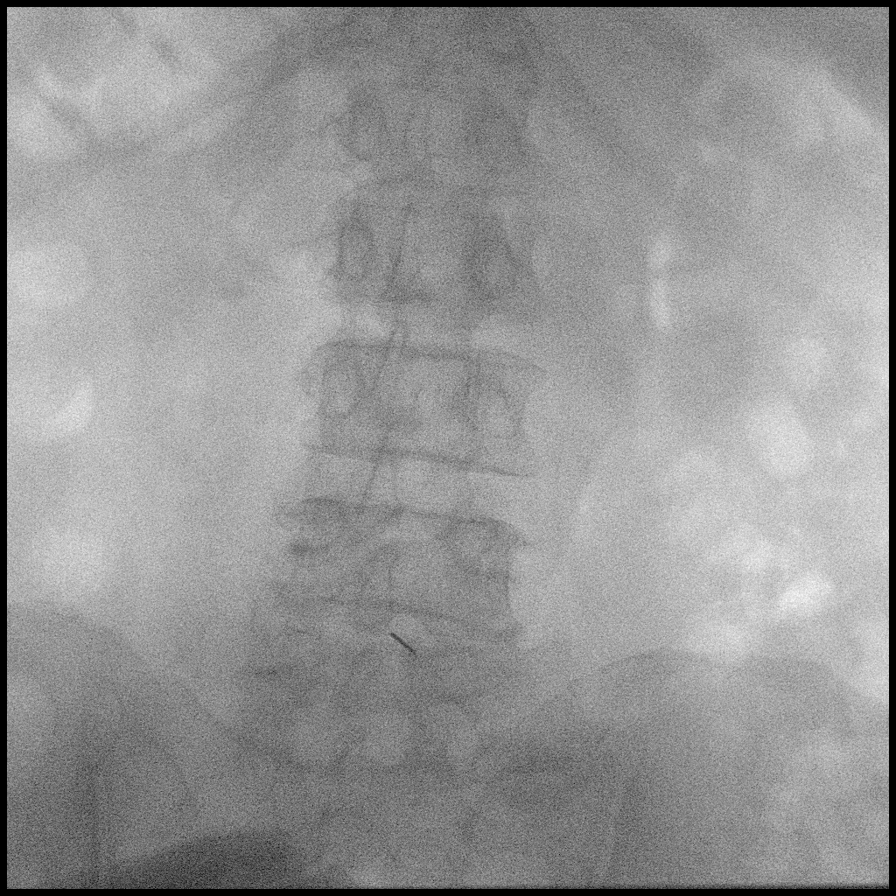

[Series 5: cp_standard · 0.17mm/px · 1 of 1 slices shown (5 of 6)]
[im 1/1]
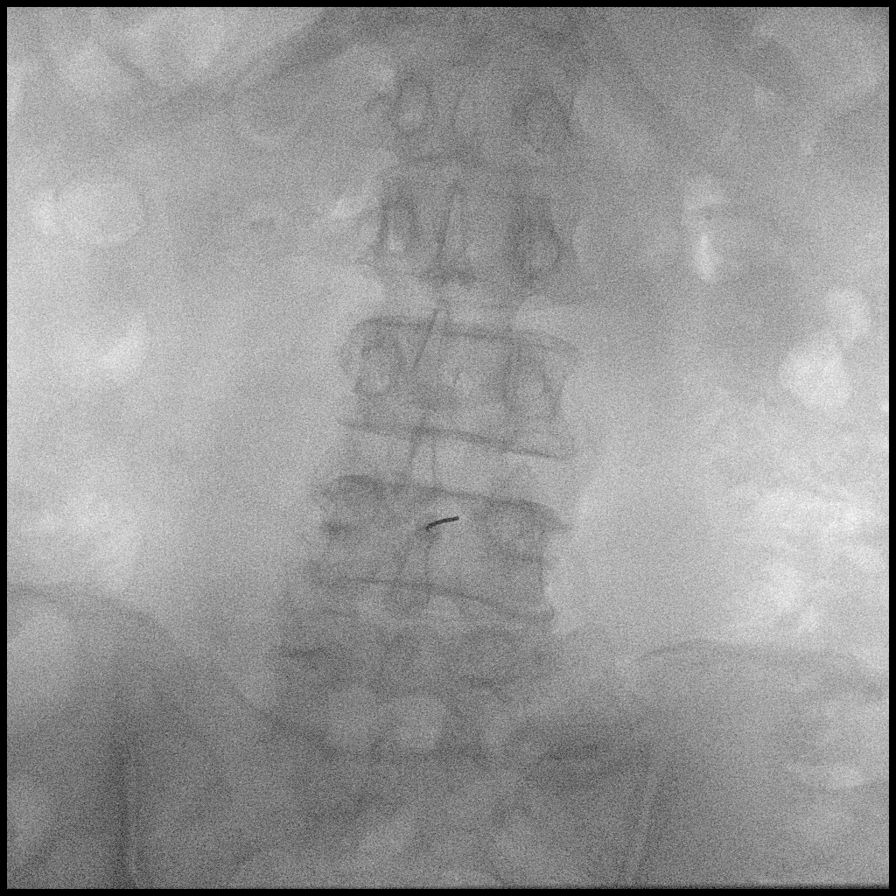

[Series 6: fluoro_iodine 2fps_bw · 0.17mm/px · 1 of 1 slices shown]
[im 1/1]
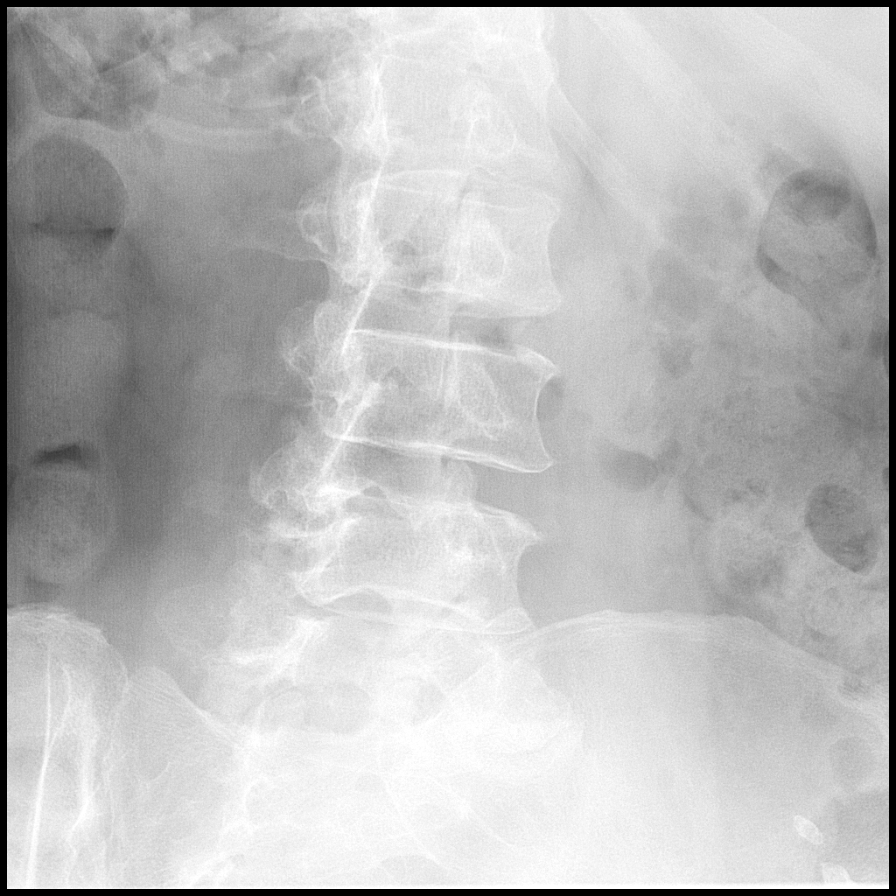

[Series 7: cp_standard · 0.17mm/px · 1 of 1 slices shown (6 of 6)]
[im 1/1]
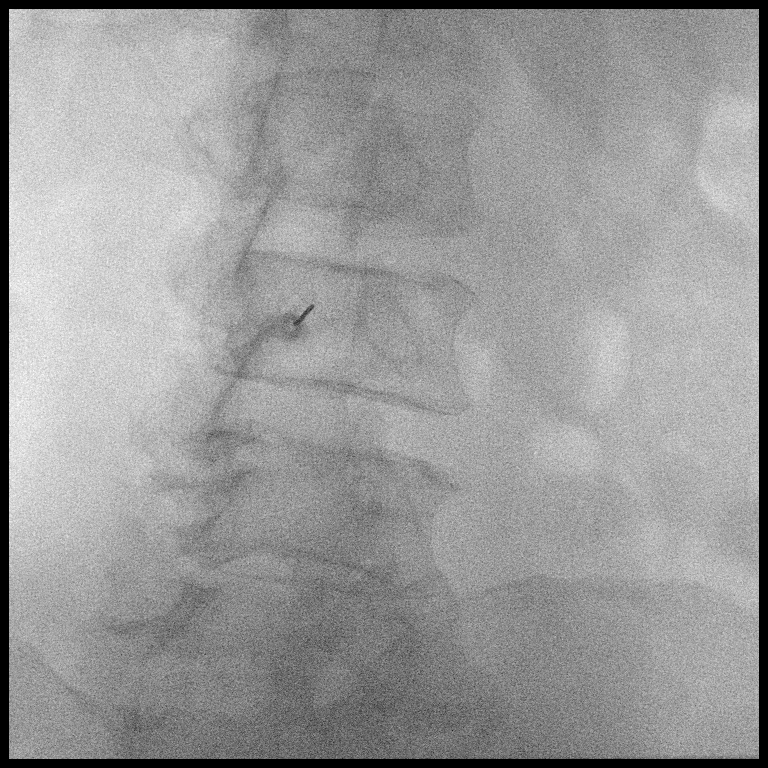

[7 of 7 positions shown; findings below may reference images not displayed]

MEDICATIONS:
None.

CONTRAST:  None.

FLUOROSCOPY TIME:  5 minutes, 18 seconds (59.6 mGy)

COMPLICATIONS:
None immediate
Informed written consent was obtained from the patient after a
discussion of the risks, benefits and alternatives to treatment.
Questions regarding the procedure were encouraged and answered. A
timeout was performed prior to the initiation of the procedure.

The patient was placed prone, slightly RPO on the fluoroscopy table.
The right at L2 - L3 transverse foramina was marked
fluoroscopically. The skin overlying the operative site was prepped
and draped in the usual sterile fashion. Local anesthesia was
provided with 1% lidocaine.

Under intermittent fluoroscopic guidance, a 22 gauge spinal needle
was advanced into the spinal canal. Appropriate positioning was
confirmed with the efflux of clear CSF. Opening pressure was
obtained. Approximately 14 ml of cerebrospinal fluid was collected
into four separate containers. Closing pressure was obtained.

Samples were sent to the Laboratory as requested per the clinical
team.

The needle was removed and hemostasis was achieved with manual
compression. A dressing was placed. The patient tolerated the above
procedure well without immediate postprocedural complication.
FINDINGS: Fluoroscopic imaging demonstrates appropriate positioning of the
spinal needle within the spinal canal. Successful fluoroscopic
lumbar puncture yielding approximately 14 ml of clear cerebrospinal
fluid.

Opening pressure - 28 mmHg

Closing pressure - 19 mmHg.
IMPRESSION: Technically successful fluoroscopic guided lumbar puncture with
opening pressure of 28 mmHg and closing pressure of 19 mmHg. Samples
were sent to the Laboratory as requested per the clinical team.

## 2020-11-12 IMAGING — MR MR HEAD WO/W CM
9 of 15 series · 26 of 48 positions shown · IV contrast (gadavist)
Comparison: CT head without contrast [DATE]

CLINICAL DATA: Multiple sclerosis. New onset right facial droop.
Symmetric bilateral upper and lower extremity weakness.

EXAM:
MRI HEAD WITHOUT AND WITH CONTRAST
TECHNIQUE: Multiplanar, multiecho pulse sequences of the brain and surrounding
structures were obtained without and with intravenous contrast.
CONTRAST:  8mL GADAVIST GADOBUTROL 1 MMOL/ML IV SOLN

[Series 2: DWI · axial · 3.0mm · 0.94mm/px · z∈[-96,+47]mm · 5 of 100 slices shown (1 of 4)]
[im 1/100]
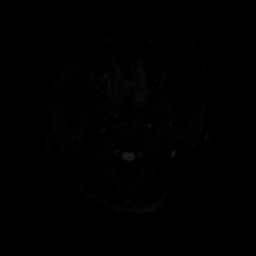
[im 25/100]
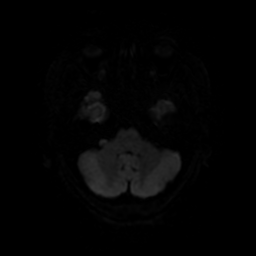
[im 50/100]
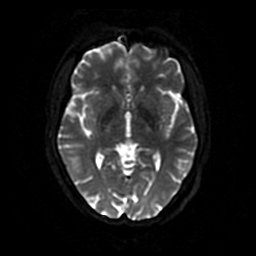
[im 75/100]
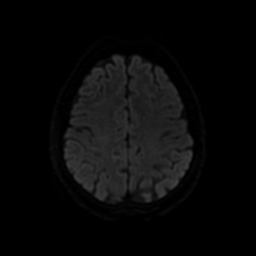
[im 100/100]
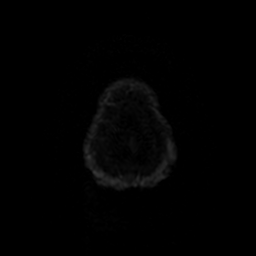

[Series 3: DWI · coronal · 4.0mm · 0.94mm/px · 3 of 74 slices shown (2 of 4)]
[im 1/74]
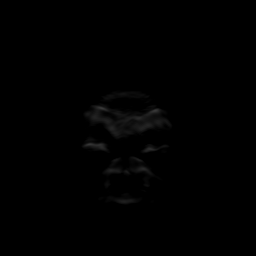
[im 37/74]
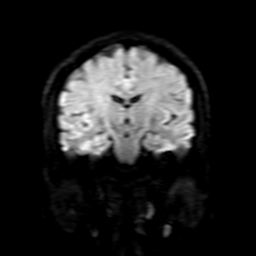
[im 74/74]
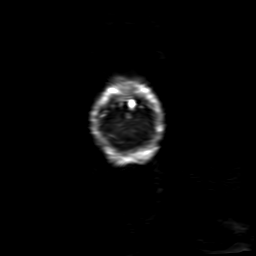

[Series 4: FLAIR · sagittal · 5.0mm · 0.23mm/px · 1 of 25 slices shown (1 of 2)]
[im 1/25]
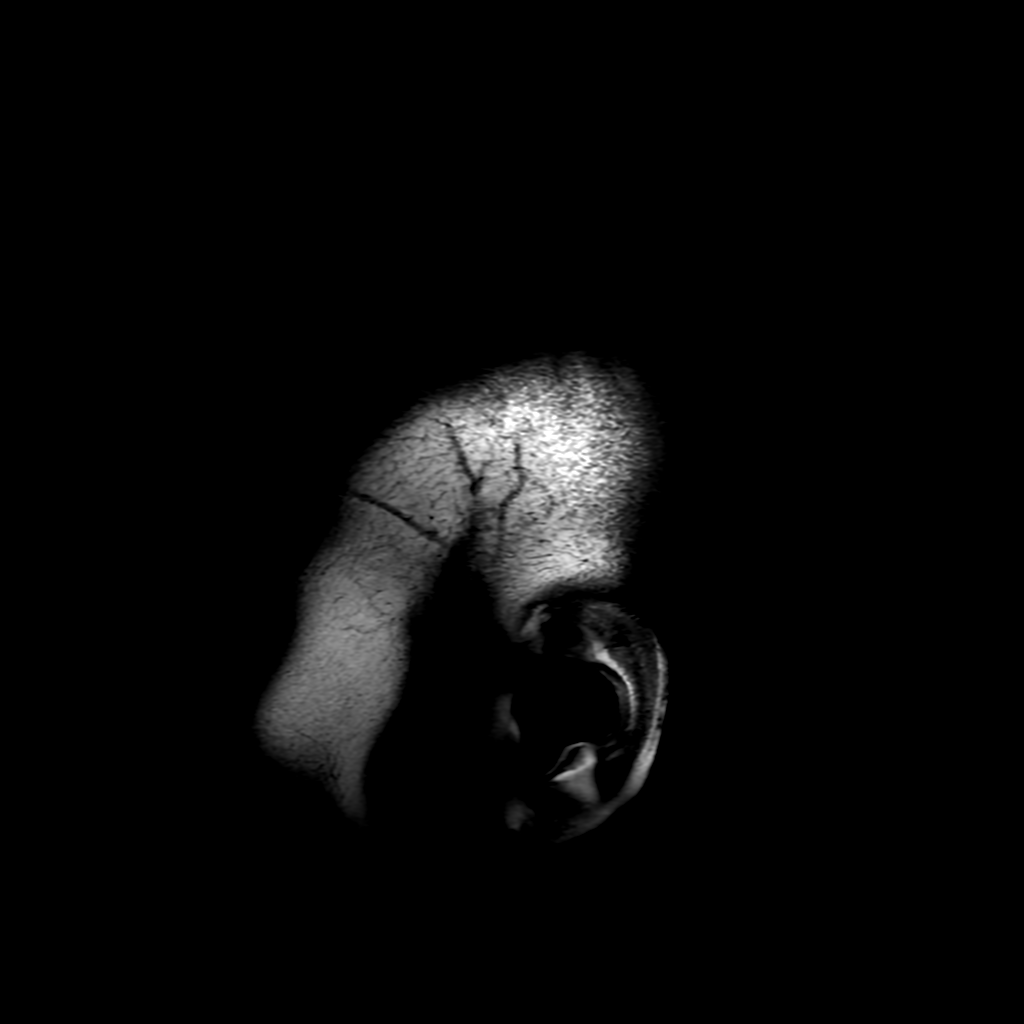

[Series 5: T2 · axial · 5.0mm · 0.23mm/px · 1 of 25 slices shown]
[im 1/25]
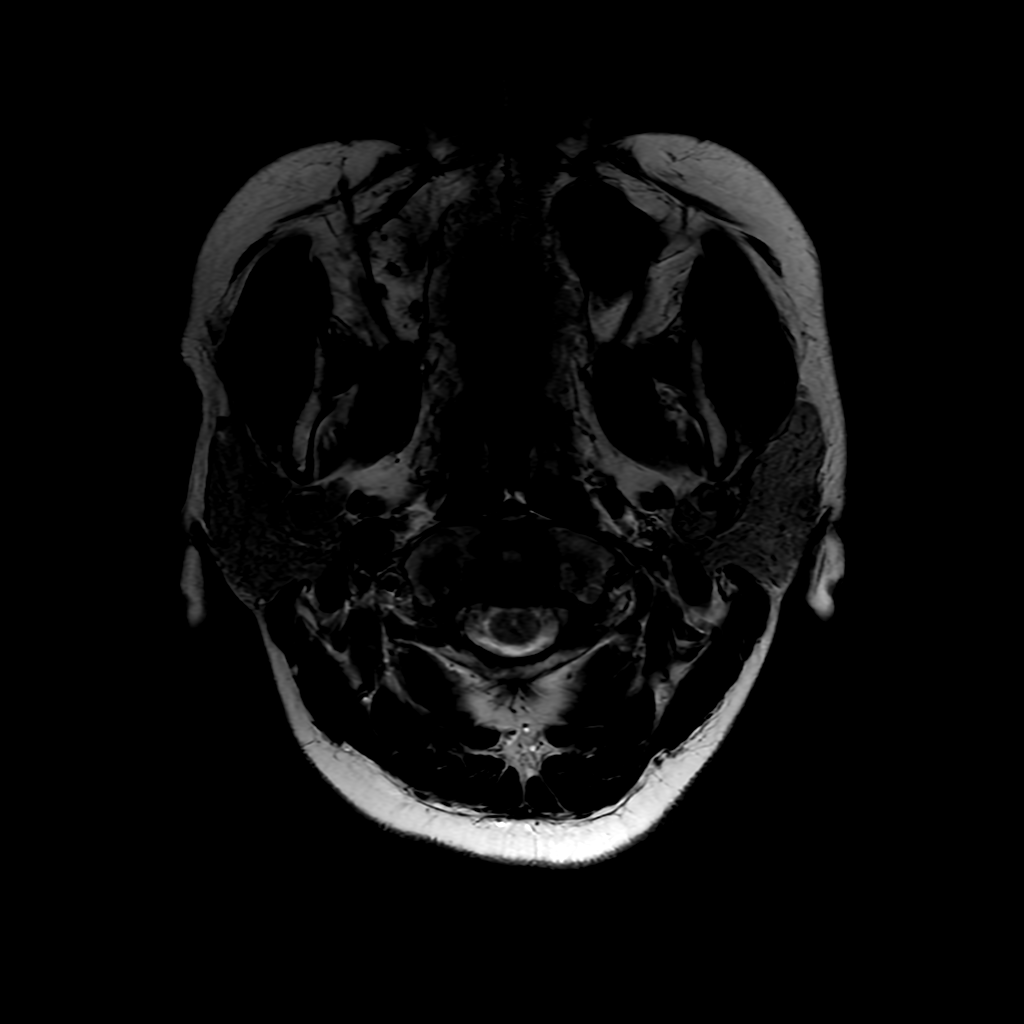

[Series 6: FLAIR · axial · 3.0mm · 0.45mm/px · 1 of 25 slices shown (2 of 2)]
[im 1/25]
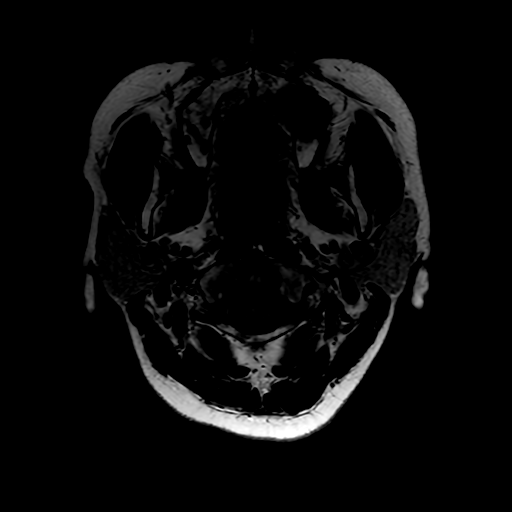

[Series 210: DWI · axial · 3.0mm · 0.94mm/px · z∈[-96,+47]mm · 6 of 100 slices shown (3 of 4)]
[im 1/100]
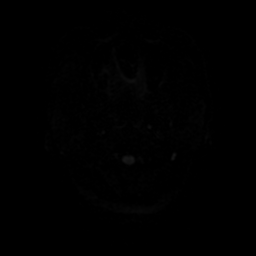
[im 20/100]
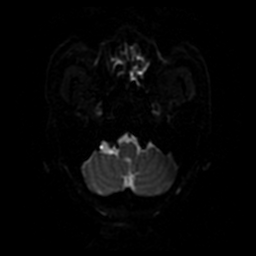
[im 40/100]
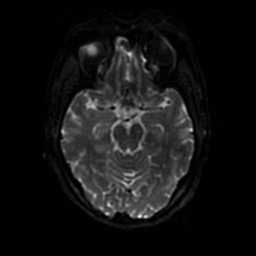
[im 60/100]
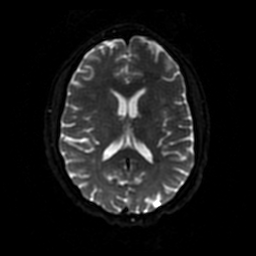
[im 80/100]
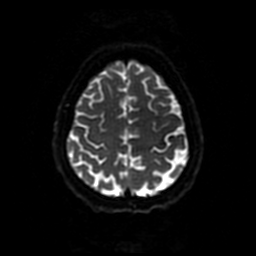
[im 100/100]
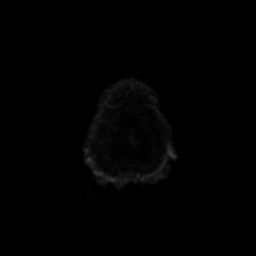

[Series 250: ADC · axial · 3.0mm · 0.94mm/px · z∈[-96,+47]mm · 3 of 50 slices shown (1 of 2)]
[im 1/50]
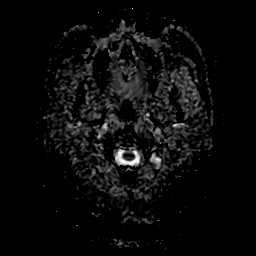
[im 25/50]
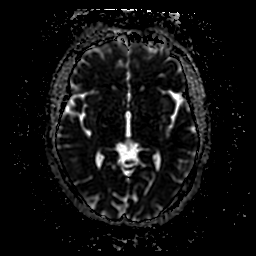
[im 50/50]
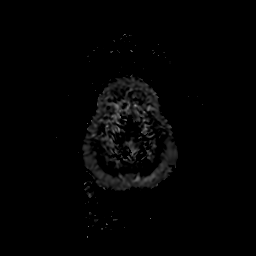

[Series 310: DWI · coronal · 4.0mm · 0.94mm/px · 4 of 73 slices shown (4 of 4)]
[im 1/73]
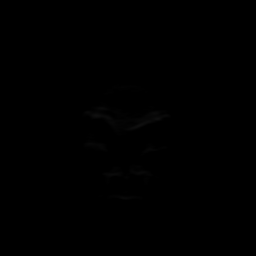
[im 25/73]
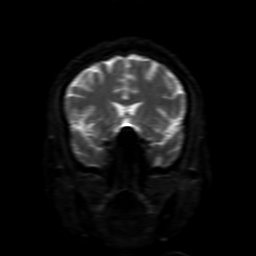
[im 49/73]
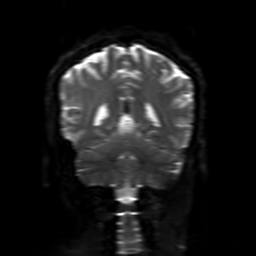
[im 73/73]
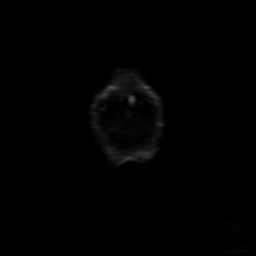

[Series 350: ADC · coronal · 4.0mm · 0.94mm/px · 2 of 37 slices shown (2 of 2)]
[im 1/37]
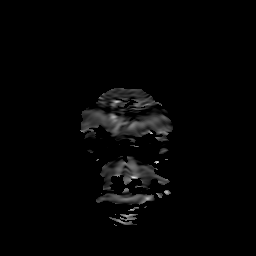
[im 37/37]
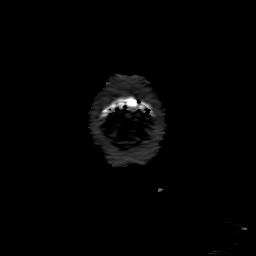

[26 of 48 positions shown; findings below may reference images not displayed]

FINDINGS: Brain: Periventricular T2 hyperintensities are mildly advanced for
age. No associated enhancement is present.

No acute infarct, hemorrhage, or mass lesion is present. Subcortical
T2 hyperintensity is noted in the left frontal operculum. The
ventricles are of normal size. Basal ganglia are within normal
limits. No significant extraaxial fluid collection is present.

The internal auditory canals are within normal limits. The brainstem
and cerebellum are within normal limits.

Postcontrast images demonstrate no pathologic enhancement.

Vascular: Flow is present in the major intracranial arteries.

Skull and upper cervical spine: The craniocervical junction is
normal. Upper cervical spine is within normal limits. Marrow signal
is unremarkable.

Sinuses/Orbits: Circumferential mucosal thickening is present the
right greater than left frontal sinuses. Mucosal thickening and
anterior left ethmoid air cells. Mild mucosal thickening is present
in the right sphenoid sinus. No fluid levels are present.

The globes and orbits are within normal limits.
IMPRESSION: 1. No acute intracranial abnormality.
2. Periventricular and subcortical T2 hyperintensities bilaterally
are mildly advanced for age. The finding is nonspecific but can be
seen in the setting of chronic microvascular ischemia, a
demyelinating process such as multiple sclerosis, vasculitis,
complicated migraine headaches, or as the sequelae of a prior
infectious or inflammatory process.

## 2020-11-12 MED ORDER — AMLODIPINE BESYLATE 10 MG PO TABS
10.0000 mg | ORAL_TABLET | Freq: Every evening | ORAL | Status: DC
  Administered 2020-11-12 – 2020-11-16 (×5): 10 mg via ORAL
  Filled 2020-11-12 (×5): qty 1

## 2020-11-12 MED ORDER — IMMUNE GLOBULIN (HUMAN) 10 GM/100ML IV SOLN
400.0000 mg/kg | INTRAVENOUS | Status: DC
  Filled 2020-11-12: qty 350

## 2020-11-12 MED ORDER — GABAPENTIN 600 MG PO TABS
600.0000 mg | ORAL_TABLET | Freq: Every evening | ORAL | Status: DC
  Administered 2020-11-12 – 2020-11-16 (×5): 600 mg via ORAL
  Filled 2020-11-12 (×5): qty 1

## 2020-11-12 MED ORDER — GADOBUTROL 1 MMOL/ML IV SOLN
8.0000 mL | Freq: Once | INTRAVENOUS | Status: AC | PRN
  Administered 2020-11-12: 8 mL via INTRAVENOUS

## 2020-11-12 MED ORDER — LIDOCAINE HCL (PF) 1 % IJ SOLN
5.0000 mL | Freq: Once | INTRAMUSCULAR | Status: AC
  Administered 2020-11-12: 23 mL via INTRADERMAL

## 2020-11-12 MED ORDER — IMMUNE GLOBULIN (HUMAN) 10 GM/100ML IV SOLN
400.0000 mg/kg | INTRAVENOUS | Status: AC
  Administered 2020-11-13 – 2020-11-16 (×5): 35 g via INTRAVENOUS
  Filled 2020-11-12: qty 200
  Filled 2020-11-12: qty 100
  Filled 2020-11-12 (×3): qty 200

## 2020-11-12 NOTE — Progress Notes (Signed)
NEUROLOGY CONSULTATION PROGRESS NOTE   Date of service: November 12, 2020 Patient Name: Veronica [name] MRN:  [id number] DOB:  1966/06/26  Brief HPI  Veronica [name] is a 54 y.o. female with PMH significant for HTN who presents with 6-7 day hx of muscle weakness, proximal > distal and symmetric. Her neurologic examination is notable for acute symmetric progressive Proximal more than distal muscle weakness of all extremities over the last few days. In addition, also has subjective pins and needles in finger tips and toes x 3-4 days. She also has absent reflexes in BL lower extremities.  Workup with MRI C spine w + w/o with no acute abnormalities. CK is mildly elevated to 247 but given that she is african [place], is probably normal.   Interval Hx   LP with elevated protein to 169, WBC count of 2 and is consistent with albuminocytologic dissociation.  After LP, developed acute onset R upper and lower facial mild weakness, STAT MRI Brain with and without contrast was negative.  Vitals   Vitals:   11/11/20 1929 11/11/20 2000 11/11/20 2025 11/11/20 2100  BP: (!) 147/105 (!) 153/96 (!) 157/103 (!) 152/86  Pulse: 78  96 74  Resp: _0 Temp: 97.9 F (36.6 C)  97.9 F (36.6 C) 98 F (36.7 C)  TempSrc: Oral  Oral Oral  SpO2: 97%  98% 100%     There is no height or weight on file to calculate BMI.  Physical Exam   General: Laying comfortably in bed; in no acute distress.  HENT: Normal oropharynx and mucosa. Normal external appearance of ears and nose.  Neck: Supple, no pain or tenderness  CV: No JVD. No peripheral edema.  Pulmonary: Symmetric Chest rise. Normal respiratory effort.  Abdomen: Soft to touch, non-tender.  Ext: No cyanosis, edema, or deformity  Skin: No rash. Normal palpation of skin.   Musculoskeletal: Normal digits and nails by inspection. No clubbing.   Neurologic Examination  Mental status/Cognition: Alert, oriented to self, place, month and year, good attention.   Speech/language: Fluent, comprehension intact, object naming intact, repetition intact.  Cranial nerves:   CN II Pupils equal and reactive to light, no VF deficits    CN III,IV,VI EOM intact, no gaze preference or deviation, no nystagmus   CN V normal sensation in V1, V2, and V3 segments bilaterally   CN VII Mild R upper and lower facial weakness with weak R eye closure.   CN VIII normal hearing to speech   CN IX & X normal palatal elevation, no uvular deviation   CN XI 5/5 head turn and 5/5 shoulder shrug bilaterally   CN XII midline tongue protrusion   Motor:  Muscle bulk: normal, tone normal. Mvmt Root Nerve  Muscle Right Left Comments  SA C5/6 Ax Deltoid 4+ 4+   EF C5/6 Mc Biceps 4+ 4+   EE C6/7/8 Rad Triceps 4+ 4+   WF C6/7 Med FCR 5 5   WE C7/8 PIN ECU 5 5   F Ab C8/T1 U ADM/FDI 5 5   HF L1/2/3 Fem Illopsoas 4+ 4+   KE L2/3/4 Fem Quad 4+ 4+   DF L4/5 D Peron Tib Ant 5 5   PF S1/2 Tibial Grc/Sol 5 5    Reflexes:  Right Left Comments  Pectoralis      Biceps (C5/6) 2 2   Brachioradialis (C5/6) 2 2    Triceps (C6/7) 2 2    Patellar (L3/4) 0 0  Achilles (S1) 0 0    Hoffman      Plantar mute mute   Jaw jerk    Sensation:  Light touch Intact but has subjective pins and needles in BL finger tips and BL toes.   Pin prick    Temperature    Vibration   Proprioception    Coordination/Complex Motor:  - Finger to Nose intact BL - Heel to shin intact BL - Rapid alternating movement are normal - Gait: deferred.  Labs   Basic Metabolic Panel:  Lab Results  Component Value Date   NA 141 11/12/2020   K 3.4 (L) 11/12/2020   CO2 25 11/12/2020   GLUCOSE 97 11/12/2020   BUN 7 11/12/2020   CREATININE 0.61 11/12/2020   CALCIUM 9.5 11/12/2020   GFRNONAA >60 11/12/2020   GFRAA  06/09/2010    >60        The eGFR has been calculated using the MDRD equation. This calculation has not been validated in all clinical situations. eGFR's persistently <60 mL/min  signify possible Chronic Kidney Disease.   HbA1c: No results found for: HGBA1C LDL: No results found for: [facility] Urine Drug Screen: No results found for: LABOPIA, COCAINSCRNUR, LABBENZ, AMPHETMU, THCU, LABBARB  Alcohol Level No results found for: ETH No results found for: PHENYTOIN, ZONISAMIDE, LAMOTRIGINE, LEVETIRACETA No results found for: PHENYTOIN, PHENOBARB, VALPROATE, CBMZ  Imaging and Diagnostic studies   MRI Brain with and without contrast: 1. No acute intracranial abnormality. 2. Periventricular and subcortical T2 hyperintensities bilaterally are mildly advanced for age. The finding is nonspecific but can be seen in the setting of chronic microvascular ischemia, a demyelinating process such as multiple sclerosis, vasculitis, complicated migraine headaches, or as the sequelae of a prior infectious or inflammatory process.  MRI C spine with and without contrast: 1. No evidence of abnormal cord signal or enhancement. 2. 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 Mild foraminal stenosis on the left at C4-C5 and the right at C6-C7.  CSF Studies: Tube #  3   Color, CSF COLORLESS COLORLESS   Appearance, CSF CLEAR CLEAR   Supernatant  NOT INDICATED   RBC Count, CSF 0 /cu mm 243High   WBC, CSF 0 - 5 /cu mm 2   Other Cells, CSF  TOO FEW TO COUNT, SMEAR AVAILABLE FOR REVIEW     Ref Range & Units 12:22  Total Protein, CSF 15 - 45 mg/dL 169High    Glucose, CSF 40 - 70 mg/dL 55    Specimen Description CSF   Special Requests NONE   Gram Stain WBC PRESENT, PREDOMINANTLY MONONUCLEAR  NO ORGANISMS SEEN  CYTOSPIN SMEAR  Performed at [facility] [address], [place], [place] [address]   Culture PENDING   Report Status PENDING     Impression   Veronica [name] is a 55 y.o. female with PMH significant for HTN who presents with acute onset symmetric proximal > distal weakness with pins and needles in BL feet and finger tips.  Workup with MRI Brain and C  spine with and without contrast with no acute abnormality. Has white matter disease but I do not think that the appearance is consistent with Multiple Sclerosis.  CSF with albuminocytologic dissociation, even when correcting for RBCs in CSF. 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  As for the R upper and lower facial droop that developed today after LP, I am not entirely sure what to make of it. I saw her a second time today  and it seems to be getting better on its own.  Recommendations  - I discussed risks and benefits of IVIG with patient. Discussed PLEX as the alternative option. Patient opted to go with IVIG. I ordered IVIG 0.4g/Kg Q24 hours x 5 days. ______________________________________________________________________   Thank you for the opportunity to take part in the care of this patient. If you have any further questions, please contact the neurology consultation attending.  Signed,  [place] Pager Number [phone]

## 2020-11-12 NOTE — Progress Notes (Signed)
PROGRESS NOTE   Veronica [name]  QQI:[id number] DOB: Mar 23, 1966 DOA: 11/11/2020 PCP: Merrilee Seashore, MD  Brief Narrative:   55 year old black female from [place] originally has lived here 21 years HTN, neuropathy query cause (on gabapentin) Present 4/1 to urgent care headache dizziness-sent to ED R0 meningitis + Bilateral lower extremity heaviness, cold sensation bilaterally to hands No autoimmune disease MS HIV Neurology consulted by ED ordered  They ordered -  MRI C spine W and W/O Contrast -  MRI Left femur W and W/O Contrast - Bedside LP with CSF cell count, differential, protein, glucose, gram stain with Cx, Oligoclonal bands, IgG index.--- Attempted x3 no LOC - Vit B12 - Serum SPEP - Serum CK - Serum Lyme Ab.  The patient was seen by me at around 2 PM on 4/2 after her LP performed at around 12 PM and exhibited new findings of right-sided facial droop in addition to upper and motor neuron findings-I discussed and Dr. Lorrin Goodell came to the bedside and evaluated patient and ordered a stat MRI brain   Hospital-Problem based course  Syrinx?  Transverse myelitis?  Autoimmune CNS disease? Defer further work-up to neurology Her new deficits were discussed with neurologist and her MRI is grossly normal Dr. Lorrin Goodell will be discussing her care with neuroradiologist for their input Follow above labs-?  IVIG versus high-dose steroid if this turns out to be a consideration for autoimmune disease HTN Continue amlodipine 10 for slow blood pressure lower over next several days  DVT prophylaxis: Lovenox Code Status: Full Family Communication:  Disposition:  Status is: Observation  The patient will require care spanning > 2 midnights and should be moved to inpatient because: Persistent severe electrolyte disturbances, Unsafe d/c plan and Inpatient level of care appropriate due to severity of illness  Dispo: The patient is from: Home              Anticipated d/c is to: Home               Patient currently is not medically stable to d/c.   Difficult to place patient No   Consultants:   Neurology  Procedures: 4/1 MR cervical spine-moderate foraminal stenosis C3-C4, mild foraminal stenosis C4-C5 and C6-C7 CT head 4/1 = partially empty sella turcica  paranasal sinus disease MRI brain 4/2-subcortical periventricular T2 hyperintensities bilaterally?  Chronic microvascular ischemia-demyelinating process (MS, vasculitis, complicated migraines)  Antimicrobials: None   Subjective: Tells me after coming back from LP she has had increasing numbness of her face and drawing up of the face and heaviness of her right lower extremity both on the right side otherwise she feels fine and is able to verbalize with me fairly well  Objective: Vitals:   11/11/20 1929 11/11/20 2000 11/11/20 2025 11/11/20 2100  BP: (!) 147/105 (!) 153/96 (!) 157/103 (!) 152/86  Pulse: 78  96 74  Resp: 15 11 14 16   Temp: 97.9 F (36.6 C)  97.9 F (36.6 C) 98 F (36.7 C)  TempSrc: Oral  Oral Oral  SpO2: 97%  98% 100%    Intake/Output Summary (Last 24 hours) at 11/12/2020 1644 Last data filed at 11/12/2020 1305 Gross per 24 hour  Intake 240 ml  Output 1 ml  Net 239 ml   There were no vitals filed for this visit.  Examination:  Face drawn up on the right side smile asymmetrical pupils equally reactive to light however orbitalis on the right side is weaker than on the left side CTA B no added sound  no rales no rhonchi Chest clear no added sound Abdomen soft no rebound no guarding    Data Reviewed: personally reviewed    Data B12 1346 CK 247 Potassium 3.4 CSF protein 169, CSF culture 4/2 Gram stain.  Cryptococcal antigen pending HIV negative SPEP/1 pending anti-DNA antibody pending Antiextractable nuclear antigen pending Borrelia burgdorferi antibody pending   Scheduled Meds: . amLODipine  10 mg Oral QPM  . gabapentin  600 mg Oral QPM   Continuous Infusions:   LOS: 0 days    Time spent: 70 minutes total  Nita Sells, MD [facility] To contact the attending provider between 7A-7P or the covering provider during after hours 7P-7A, please log into the web site [email or web address] and access using universal [place] password for that web site. If you do not have the password, please call the hospital operator.  11/12/2020, 4:44 PM

## 2020-11-12 NOTE — Procedures (Signed)
Pre procedural Dx: Headache and neck pain  Post procedural Dx: Same  Technically successful fluoroscopic guided lumbar puncture with opening pressure of  28 mmHg and closing pressure of 19 mmHg.  Samples were sent to the Laboratory as requested per the clinical team.  Opening pressure - 8 mmHg Closing pressure - 19 mmHg.  EBL: Trace Complications: None immediate  Ronny Bacon, MD Pager #: [phone]

## 2020-11-12 NOTE — Hospital Course (Addendum)
     Outpatient physical therapy supervision for mobility rolling walker 5 inch wheels

## 2020-11-13 DIAGNOSIS — G610 Guillain-Barre syndrome: Secondary | ICD-10-CM | POA: Diagnosis not present

## 2020-11-13 DIAGNOSIS — M542 Cervicalgia: Secondary | ICD-10-CM | POA: Diagnosis not present

## 2020-11-13 NOTE — Progress Notes (Signed)
IVIG just completed, pt tolerated well.

## 2020-11-13 NOTE — Progress Notes (Signed)
PROGRESS NOTE   Veronica [name]  KZS:[id number] DOB: 13-Nov-1965 DOA: 11/11/2020 PCP: Merrilee Seashore, MD  Brief Narrative:   55 year old black female from [place] originally has lived here 21 years HTN, neuropathy query cause (on gabapentin) Present 4/1 to urgent care headache dizziness-sent to ED R0 meningitis + Bilateral lower extremity heaviness, cold sensation bilaterally to hands No autoimmune disease MS HIV Neurology consulted by ED ordered  Work-up requested  The patient was seen by me at around 2 PM on 4/2 after her LP performed at around 12 PM and exhibited new findings of right-sided facial droop in addition to upper and motor neuron findings-I discussed and Dr. 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 and she is getting IVIG per Neurology   Hospital-Problem based course  Guillame Barre syndrome Plan per neurology Follow above labs-  IVIG to end 4/7 HTN Continue amlodipine 10 for slow blood pressure lower over next several days  DVT prophylaxis: Lovenox Code Status: Full Family Communication:  Disposition:  Status is: Observation  The patient will require care spanning > 2 midnights and should be moved to inpatient because: Persistent severe electrolyte disturbances, Unsafe d/c plan and Inpatient level of care appropriate due to severity of illness  Dispo: The patient is from: Home              Anticipated d/c is to: Home              Patient currently is not medically stable to [email or web address] IVIG infusion 4/7--Will ask neuro if can be done at infusion clinic? Long discussion with son bedside and provided him information re: what the reported diagnosis is   Difficult to place patient No   Consultants:   Neurology  Procedures: 4/1 MR cervical spine-moderate foraminal stenosis C3-C4, mild foraminal stenosis C4-C5 and C6-C7 CT head 4/1 = partially  empty sella turcica  paranasal sinus disease MRI brain 4/2-subcortical periventricular T2 hyperintensities bilaterally?  Chronic microvascular ischemia-demyelinating process (MS, vasculitis, complicated migraines)  Antimicrobials: None   Subjective: Doing better R thigh discomfort improved No cp Still a little drawing up of R side face  Objective: Vitals:   11/13/20 0306 11/13/20 0420 11/13/20 0719 11/13/20 1106  BP: 131/72 137/84 (!) 132/91 140/89  Pulse: 79 77 76 79  Resp: 18 19  18   Temp: 98.1 F (36.7 C) 98.3 F (36.8 C) 97.9 F (36.6 C) 97.9 F (36.6 C)  TempSrc: Oral Oral Oral Oral  SpO2: 100% 100% 100% 100%  Weight:        Intake/Output Summary (Last 24 hours) at 11/13/2020 1322 Last data filed at 11/13/2020 0001 Gross per 24 hour  Intake 350 ml  Output --  Net 350 ml   Filed Weights   11/12/20 1831  Weight: 82.5 kg    Examination:  Face drawn up on the right side smile Still slightly weaker occularis musle CTA B no added sound no rales no rhonchi Chest clear no added sound Abdomen soft no rebound no guarding  Data Reviewed: personally reviewed    Data Results for POETRY, CERRO (MRN [id number]) as of 11/13/2020 13:27  Ref. Range 11/12/2020 12:22  Appearance, CSF Latest Ref Range: CLEAR  CLEAR  Glucose, CSF Latest Ref Range: 40 - 70 mg/dL 55  RBC Count, CSF Latest Ref Range: 0 /cu mm 243 (H)  WBC, CSF Latest Ref Range: 0 - 5 /cu mm 2  Other Cells, CSF Unknown TOO FEW TO COUNT, SMEAR AVAILABLE FOR REVIEW  Color, CSF Latest Ref Range: COLORLESS  COLORLESS  Supernatant Unknown NOT INDICATED  Total  Protein, CSF Latest Ref Range: 15 - 45 mg/dL 169 (H)  Tube # Unknown 3    CSF culture 4/2 Gram stain.  Cryptococcal antigen [-] HIV [-]  SPEP/1 pending anti-DNA antibody pending Antiextractable nuclear antigen pending Borrelia burgdorferi antibody pending   Scheduled Meds: . amLODipine  10 mg Oral QPM  . gabapentin  600 mg Oral QPM   Continuous  Infusions: . Immune Globulin 10% 35 g (11/13/20 0001)     LOS: 1 day   Time spent: 15 minutes total  Nita Sells, MD [facility] To contact the attending provider between 7A-7P or the covering provider during after hours 7P-7A, please log into the web site [email or web address] and access using universal [place] password for that web site. If you do not have the password, please call the hospital operator.  11/13/2020, 1:22 PM

## 2020-11-13 NOTE — Progress Notes (Signed)
Neurology Progress Note  S: No overnight events. Patient states she had pain after the LP yesterday and that has resolved. She says she is not feeling as much numbness and tingling. No issues with voiding. States she has not been OOB.   O: Current vital signs: BP (!) 132/91 (BP Location: Left Arm)   Pulse 76   Temp 97.9 F (36.6 C) (Oral)   Resp 19   Wt 82.5 kg   SpO2 100%  Vital signs in last 24 hours: Temp:  [97.4 F (36.3 C)-99.1 F (37.3 C)] 97.9 F (36.6 C) (04/03 0719) Pulse Rate:  [76-86] 76 (04/03 0719) Resp:  [16-20] 19 (04/03 0420) BP: (121-158)/(72-96) 132/91 (04/03 0719) SpO2:  [97 %-100 %] 100 % (04/03 0719) Weight:  [82.5 kg] 82.5 kg (04/02 1831)  GENERAL: Awake, alert in NAD HEENT: Normocephalic and atraumatic LUNGS: Normal respiratory effort.  CV: RRR Ext: warm Psych: calm   NEURO:  Mental Status: AA&Ox3  Speech/Language: speech is without aphasia or dysarthria.  Naming, repetition, fluency, and comprehension intact.  Cranial Nerves:  II: PERRL. Visual fields full.  III, IV, VI: EOMI. Eyelids elevate symmetrically.  V: Sensation is intact to light touch and symmetrical to face.  VII: right upper and lower facial weakness.  VIII: hearing intact to voice. IX, X: Palate elevates symmetrically. Phonation is normal.  XI: Shoulder shrug 5/5. XII: tongue is midline without fasciculations. Motor: Deltoid, biceps, and triceps are 4+ bilaterally. Illopsoas 4+ bilaterally. Quadriceps 4+/5. Plantar and dorsiflexion 5/5 bilaterally.  Tone: is normal and bulk is normal Sensation- Intact to light touch bilaterally. Extinction absent to light touch to DSS.    Coordination: FTN intact bilaterally DTRs: Brachioradialis 2+ bilaterally. Triceps 1+ bilaterally. Patella 0 bilaterally. Achilles 0 bilaterally.  Gait- deferred  Medications  Current Facility-Administered Medications:  .  acetaminophen (TYLENOL) tablet 650 mg, 650 mg, Oral, Q6H PRN, 650 mg at 11/13/20 0247  **OR** acetaminophen (TYLENOL) suppository 650 mg, 650 mg, Rectal, Q6H PRN, Kc, Ramesh, MD .  amLODipine (NORVASC) tablet 10 mg, 10 mg, Oral, QPM, Kc, Ramesh, MD, 10 mg at 11/12/20 1802 .  gabapentin (NEURONTIN) tablet 600 mg, 600 mg, Oral, QPM, Kc, Ramesh, MD, 600 mg at 11/12/20 1802 .  Immune Globulin 10% (PRIVIGEN) IV infusion 35 g, 400 mg/kg, Intravenous, Q24 Hr x 5, Shigeru Lampert, MD, Last Rate: 24.8 mL/hr at 11/13/20 0001, 35 g at 11/13/20 0001 .  methocarbamol (ROBAXIN) tablet 500 mg, 500 mg, Oral, Q6H PRN, Kc, Ramesh, MD .  promethazine (PHENERGAN) tablet 12.5 mg, 12.5 mg, Oral, Q6H PRN, Antonieta Pert, MD  Pertinent Labs Results for Veronica [name], Veronica [name] (MRN [id number]) as of 11/13/2020 10:12  Ref. Range 11/12/2020 12:22  Appearance, CSF Latest Ref Range: CLEAR  CLEAR  Glucose, CSF Latest Ref Range: 40 - 70 mg/dL 55  RBC Count, CSF Latest Ref Range: 0 /cu mm 243 (H)  WBC, CSF Latest Ref Range: 0 - 5 /cu mm 2  Other Cells, CSF Unknown TOO FEW TO COUNT, SMEAR AVAILABLE FOR REVIEW  Color, CSF Latest Ref Range: COLORLESS  COLORLESS  Supernatant Unknown NOT INDICATED  Total  Protein, CSF Latest Ref Range: 15 - 45 mg/dL 169 (H)  Tube # Unknown 3   Imaging MD has reviewed images in epic and the results pertinent to this consultation are:  CT Head No evidence of acute intracranial abnormality. Partially empty sella turcica. This finding is very commonly incidental, but can be associated with idiopathic intracranial Hypertension.  MRI Brain  1.  No acute intracranial abnormality. 2. Periventricular and subcortical T2 hyperintensities bilaterally are mildly advanced for age. The finding is nonspecific but can be seen in the setting of chronic microvascular ischemia, a demyelinating process such as multiple sclerosis, vasculitis, complicated migraine headaches, or as the sequelae of a prior infectious or inflammatory process.  MRI C spine: 1. No evidence of abnormal cord signal or  enhancement. 2. 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 Mild foraminal stenosis on the left at C4-C5 and the right at C6-C7.  Assessment: 55 yo [place] female who presented with acute onset of symmetric proximal to distal weakness with pins and needles in bilateral feet and finger tips. 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 Numbness and tingling is subjectively improved today.   Plan:  1. PT/OT consult 2. Continue IVIG 400mg /Kg Q24 hrs for total of 5 doses, last dose on 11/17/20. 3. Continue to evaluate progress.  Pt seen by Clance Boll, MSN, APN-BC/Nurse Practitioner/Neuro and later by MD. Note and plan to be edited as needed by MD.  Pager: [phone]  NEUROHOSPITALIST ADDENDUM Performed a face to face diagnostic evaluation.   I have reviewed the contents of history and physical exam as documented by PA/ARNP/Resident and agree with above documentation.  I have discussed and formulated the above plan as documented. Edits to the note have been made as needed.  Impression/Key exam findings/Plan: Veronica [name] is a 55 y.o. female with PMH significant for HTNwho presents with acute onset symmetric proximal > distal weakness with pins and needles in BL feet and finger tips. Workup with MRI Brain and C spine with and without contrast with no acute abnormality. Has white matter disease but I do not think that the appearance is consistent with Multiple Sclerosis. CSF with albuminocytologic dissociation, even when correcting for RBCs in CSF. CSF cx continue to be negative. IgG Index and Oligoclonal bands are pending. 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 Started on IVIG 400mg /Kg x 5 days with last dose on 11/17/20.  Donnetta Simpers, MD [facility] [phone]   If 7pm to 7am, please call on call as listed on AMION.

## 2020-11-14 DIAGNOSIS — G510 Bell's palsy: Secondary | ICD-10-CM | POA: Diagnosis not present

## 2020-11-14 DIAGNOSIS — G610 Guillain-Barre syndrome: Secondary | ICD-10-CM | POA: Diagnosis not present

## 2020-11-14 DIAGNOSIS — M359 Systemic involvement of connective tissue, unspecified: Secondary | ICD-10-CM | POA: Diagnosis not present

## 2020-11-14 LAB — COMPREHENSIVE METABOLIC PANEL
ALT: 24 U/L (ref 0–44)
AST: 22 U/L (ref 15–41)
Albumin: 3.2 g/dL — ABNORMAL LOW (ref 3.5–5.0)
Alkaline Phosphatase: 64 U/L (ref 38–126)
Anion gap: 6 (ref 5–15)
BUN: 9 mg/dL (ref 6–20)
CO2: 28 mmol/L (ref 22–32)
Calcium: 9.1 mg/dL (ref 8.9–10.3)
Chloride: 103 mmol/L (ref 98–111)
Creatinine, Ser: 0.62 mg/dL (ref 0.44–1.00)
GFR, Estimated: >60 mL/min (ref >60)
Glucose, Bld: 109 mg/dL — ABNORMAL HIGH (ref 70–99)
Potassium: 3.4 mmol/L — ABNORMAL LOW (ref 3.5–5.1)
Sodium: 137 mmol/L (ref 135–145)
Total Bilirubin: 0.7 mg/dL (ref 0.3–1.2)
Total Protein: 8.2 g/dL — ABNORMAL HIGH (ref 6.5–8.1)

## 2020-11-14 LAB — CBC WITH DIFFERENTIAL/PLATELET
Abs Immature Granulocytes: 0 10*3/uL (ref 0.00–0.07)
Basophils Absolute: 0 10*3/uL (ref 0.0–0.1)
Basophils Relative: 1 %
Eosinophils Absolute: 0 10*3/uL (ref 0.0–0.5)
Eosinophils Relative: 1 %
HCT: 38.1 % (ref 36.0–46.0)
Hemoglobin: 12.8 g/dL (ref 12.0–15.0)
Immature Granulocytes: 0 %
Lymphocytes Relative: 35 %
Lymphs Abs: 1.1 10*3/uL (ref 0.7–4.0)
MCH: 27.4 pg (ref 26.0–34.0)
MCHC: 33.6 g/dL (ref 30.0–36.0)
MCV: 81.6 fL (ref 80.0–100.0)
Monocytes Absolute: 0.3 10*3/uL (ref 0.1–1.0)
Monocytes Relative: 10 %
Neutro Abs: 1.6 10*3/uL — ABNORMAL LOW (ref 1.7–7.7)
Neutrophils Relative %: 53 %
Platelets: 431 10*3/uL — ABNORMAL HIGH (ref 150–400)
RBC: 4.67 MIL/uL (ref 3.87–5.11)
RDW: 13.2 % (ref 11.5–15.5)
WBC: 3.1 10*3/uL — ABNORMAL LOW (ref 4.0–10.5)
nRBC: 0 % (ref 0.0–0.2)

## 2020-11-14 LAB — PROTEIN ELECTROPHORESIS, SERUM
A/G Ratio: 1.5 (ref 0.7–1.7)
Albumin ELP: 4.1 g/dL (ref 2.9–4.4)
Alpha-1-Globulin: 0.2 g/dL (ref 0.0–0.4)
Alpha-2-Globulin: 0.6 g/dL (ref 0.4–1.0)
Beta Globulin: 1 g/dL (ref 0.7–1.3)
Gamma Globulin: 1 g/dL (ref 0.4–1.8)
Globulin, Total: 2.8 g/dL (ref 2.2–3.9)
M-Spike, %: 0.1 g/dL — ABNORMAL HIGH
Total Protein ELP: 6.9 g/dL (ref 6.0–8.5)

## 2020-11-14 LAB — ANTI-DNA ANTIBODY, DOUBLE-STRANDED: ds DNA Ab: 2 IU/mL (ref 0–9)

## 2020-11-14 LAB — ANTIEXTRACTABLE NUCLEAR AG
ENA SM Ab Ser-aCnc: <0.2 AI (ref 0.0–0.9)
Ribonucleic Protein: <0.2 AI (ref 0.0–0.9)

## 2020-11-14 LAB — IGG CSF INDEX
Albumin CSF-mCnc: 70 mg/dL — ABNORMAL HIGH (ref 8–37)
Albumin: 6 g/dL — ABNORMAL HIGH (ref 3.8–4.9)
CSF IgG Index: 1 — ABNORMAL HIGH (ref 0.0–0.7)
IgG (Immunoglobin G), Serum: 1182 mg/dL (ref 586–1602)
IgG, CSF: 13.5 mg/dL — ABNORMAL HIGH (ref 0.0–6.7)
IgG/Alb Ratio, CSF: 0.19 (ref 0.00–0.25)

## 2020-11-14 MED ORDER — VALACYCLOVIR HCL 500 MG PO TABS
1000.0000 mg | ORAL_TABLET | Freq: Three times a day (TID) | ORAL | Status: DC
  Administered 2020-11-14 – 2020-11-17 (×10): 1000 mg via ORAL
  Filled 2020-11-14 (×10): qty 2

## 2020-11-14 MED ORDER — PREDNISONE 20 MG PO TABS
60.0000 mg | ORAL_TABLET | Freq: Every day | ORAL | Status: DC
  Administered 2020-11-14 – 2020-11-17 (×4): 60 mg via ORAL
  Filled 2020-11-14 (×4): qty 3

## 2020-11-14 NOTE — Progress Notes (Addendum)
PROGRESS NOTE   Veronica [name]  KDX:[id number] DOB: [birth year] DOA: 11/11/2020 PCP: Veronica Seashore, MD  Brief Narrative:   56 year old black female from [place] originally has lived here 21 years HTN, neuropathy query cause (on gabapentin) Present 4/1 to urgent care headache dizziness-sent to ED R0 meningitis + Bilateral lower extremity heaviness, cold sensation bilaterally to hands No autoimmune disease MS HIV Neurology consulted by ED ordered  Work-up requested  The patient was seen by me at around 2 PM on 4/2 after her LP performed at around 12 PM and exhibited new findings of right-sided facial droop in addition to upper and motor neuron findings-I discussed and Veronica [name] came to the bedside and evaluated patient and ordered a stat MRI brain  It is now felts based on work-up that patient has Guillaume-barre syndrome and she is getting IVIG per Neurology   Hospital-Problem based course  Guillame Barre syndrome Plan per neurology Follow above labs-  IVIG to end 4/7--patient does not wish to complete her IVIG at home with RN and home health and wishes to complete it here ?  Need outpatient follow-up with [place] neurology-Veronica [name] coordinating with Dr. Morrie [name] with regards to follow-up Right-sided Bell's palsy Valacyclovir prednisone with taper per neurologist HTN Continue amlodipine 10 for slow blood pressure lower over next several days  DVT prophylaxis: Lovenox Code Status: Full Family Communication:  Disposition:  Status is: Observation  The patient will require care spanning > 2 midnights and should be moved to inpatient because: Persistent severe electrolyte disturbances, Unsafe d/c plan and Inpatient level of care appropriate due to severity of illness  Dispo: The patient is from: Home              Anticipated d/c is to: Home              Patient currently is not medically stable to [email or web address] IVIG infusion 4/7--needs outpatient therapy 3 and 1 rolling  walker in addition   Difficult to place patient No   Consultants:   Neurology  Procedures: 4/1 MR cervical spine-moderate foraminal stenosis C3-C4, mild foraminal stenosis C4-C5 and C6-C7 CT head 4/1 = partially empty sella turcica  paranasal sinus disease MRI brain 4/2-subcortical periventricular T2 hyperintensities bilaterally?  Chronic microvascular ischemia-demyelinating process (MS, vasculitis, complicated migraines)  Antimicrobials: None   Subjective: Improving well Ambulated down the hall with 2 assist per patient She still has some residual right-sided numbness-I tried to explain to her in layman's terms what is Bell's palsy No son present today-he is at school-she states she wants to complete all of her medication in the IV in the hospital   Objective: Vitals:   11/14/20 0218 11/14/20 0300 11/14/20 0739 11/14/20 1107  BP: 132/87 (!) 138/96 139/83 (!) 147/90  Pulse: 75 84 72 80  Resp: 18 18 16 16   Temp: 97.9 F (36.6 C) 98.2 F (36.8 C) 97.6 F (36.4 C) 97.8 F (36.6 C)  TempSrc: Oral Oral Oral Oral  SpO2: 99% 100% 100% 100%  Weight:       No intake or output data in the 24 hours ending 11/14/20 1154 Filed Weights   11/12/20 1831  Weight: 82.5 kg    Examination:  Facial asymmetry persists Right side >left side orbicularis oris weakness CTA B Abdomen soft nontender Power 5/5 right upper, left upper extremity Slightly diminished in the right lower at hip Sensory intact however grossly Shoulder shrug intact Uvula midline  Data Reviewed: personally reviewed    Data Results for Veronica [name] (  MRN [id number]) as of 11/13/2020 13:27  Ref. Range 11/12/2020 12:22  Appearance, CSF Latest Ref Range: CLEAR  CLEAR  Glucose, CSF Latest Ref Range: 40 - 70 mg/dL 55  RBC Count, CSF Latest Ref Range: 0 /cu mm 243 (H)  WBC, CSF Latest Ref Range: 0 - 5 /cu mm 2  Other Cells, CSF Unknown TOO FEW TO COUNT, SMEAR AVAILABLE FOR REVIEW  Color, CSF Latest Ref Range:  COLORLESS  COLORLESS  Supernatant Unknown NOT INDICATED  Total  Protein, CSF Latest Ref Range: 15 - 45 mg/dL 169 (H)  Tube # Unknown 3    CSF culture 4/2 no growth to date Gram stain  Cryptococcal antigen [-] HIV [-]  SPEP/1 pending Oligoclonal banding pending IgG CSF pending anti-DNA antibody pending Antiextractable nuclear antigen pending Borrelia burgdorferi antibody pending ACE level is pending   Scheduled Meds: . amLODipine  10 mg Oral QPM  . gabapentin  600 mg Oral QPM  . predniSONE  60 mg Oral QAC breakfast  . valACYclovir  1,000 mg Oral TID   Continuous Infusions: . Immune Globulin 10% Stopped (11/14/20 0217)     LOS: 2 days   Time spent: 15 minutes total  Veronica Sells, MD [facility] To contact the attending provider between 7A-7P or the covering provider during after hours 7P-7A, please log into the web site [email or web address] and access using universal [place] password for that web site. If you do not have the password, please call the hospital operator.  11/14/2020, 11:54 AM

## 2020-11-14 NOTE — Evaluation (Signed)
Occupational Therapy Evaluation Patient Details Name: Veronica [name] MRN: [id number] DOB: 1966/03/31 Today's Date: 11/14/2020    History of Present Illness Pt is a 55 y/o female with PMH of HTN, chronic back pain presenting to ED for evaluation of acute onset symmetric proximal to distal weakness, headache, dizziness, muscle pain for 3 days. MRI negative. 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   Clinical Impression   PTA patient independent and working, driving. Admitted for above and presenting with problem list below, including generalized weakness, decreased activity tolerance and impaired balance, as well as numbness/tingling in B fingers.  She currently completes transfers and mobility with min guard to close supervision assist using RW in room, UB ADls with setup assist and LB ADLs with close supervision.  She will benefit from further OT services while admitted and after dc at outpatient OT level to optimize independence, safety and progress towards return to work.      Follow Up Recommendations  Outpatient OT;Supervision - Intermittent    Equipment Recommendations  3 in 1 bedside commode    Recommendations for Other Services       Precautions / Restrictions Precautions Precautions: Fall Restrictions Weight Bearing Restrictions: No      Mobility Bed Mobility Overal bed mobility: Modified Independent                  Transfers Overall transfer level: Needs assistance Equipment used: None;Rolling walker (2 wheeled) Transfers: Sit to/from Stand Sit to Stand: Min guard;Supervision         General transfer comment: min guard to supervision for safety    Balance Overall balance assessment: Needs assistance Sitting-balance support: No upper extremity supported;Feet supported Sitting balance-Leahy Scale: Good     Standing balance support: Bilateral upper extremity supported;No upper extremity  supported;During functional activity Standing balance-Leahy Scale: Fair Standing balance comment: preference to UE support, min guard to close supervision dynamically                           ADL either performed or assessed with clinical judgement   ADL Overall ADL's : Needs assistance/impaired     Grooming: Min guard;Standing   Upper Body Bathing: Set up;Sitting   Lower Body Bathing: Min guard;Sit to/from stand   Upper Body Dressing : Set up;Sitting   Lower Body Dressing: Min guard;Sit to/from stand Lower Body Dressing Details (indicate cue type and reason): able to manage socks, min guard dynamically in standing Toilet Transfer: Min guard;Ambulation;RW;Regular Toilet   Toileting- Water quality scientist and Hygiene: Supervision/safety;Sit to/from stand       Functional mobility during ADLs: Min guard;Rolling walker General ADL Comments: pt limited by weakness, activity tolerance, balance     Vision Patient Visual Report: No change from baseline       Perception     Praxis      Pertinent Vitals/Pain Pain Assessment: No/denies pain     Hand Dominance Right   Extremity/Trunk Assessment Upper Extremity Assessment Upper Extremity Assessment: Generalized weakness (B fingers numbness/tingling)   Lower Extremity Assessment Lower Extremity Assessment: Defer to PT evaluation   Cervical / Trunk Assessment Cervical / Trunk Assessment: Normal   Communication Communication Communication: No difficulties   Cognition Arousal/Alertness: Awake/alert Behavior During Therapy: WFL for tasks assessed/performed Overall Cognitive Status: Within Functional Limits for tasks assessed  General Comments       Exercises     Shoulder Instructions      Home Living Family/patient expects to be discharged to:: Private residence Living Arrangements: Children (13 y/o son, in school) Available Help at Discharge:  Family;Available PRN/intermittently Type of Home: Apartment Home Access: Level entry     Home Layout: Two level;Bed/bath upstairs Alternate Level Stairs-Number of Steps: flight   Bathroom Shower/Tub: Teacher, early years/pre: Standard     Home Equipment: None          Prior Functioning/Environment Level of Independence: Independent        Comments: driving and independent, 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        OT Problem List: Decreased strength;Decreased activity tolerance;Impaired balance (sitting and/or standing);Decreased knowledge of use of DME or AE;Decreased knowledge of precautions      OT Treatment/Interventions: Self-care/ADL training;DME and/or AE instruction;Therapeutic activities;Patient/family education;Balance training;Neuromuscular education;Energy conservation    OT Goals(Current goals can be found in the care plan section) Acute Rehab OT Goals Patient Stated Goal: home and to get stronger/back to work OT Goal Formulation: With patient Time For Goal Achievement: 11/28/20 Potential to Achieve Goals: Good  OT Frequency: Min 2X/week   Barriers to D/C:            Co-evaluation PT/OT/SLP Co-Evaluation/Treatment: Yes Reason for Co-Treatment: For patient/therapist safety;To address functional/ADL transfers   OT goals addressed during session: ADL's and self-care      AM-PAC OT "6 Clicks" Daily Activity     Outcome Measure Help from another person eating meals?: None Help from another person taking care of personal grooming?: A Little Help from another person toileting, which includes using toliet, bedpan, or urinal?: A Little Help from another person bathing (including washing, rinsing, drying)?: A Little Help from another person to put on and taking off regular upper body clothing?: A Little Help from another person to put on and taking off regular lower body clothing?: A Little 6 Click Score: 19   End of Session Equipment Utilized  During Treatment: Rolling walker Nurse Communication: Mobility status  Activity Tolerance: Patient tolerated treatment well Patient left: Other (comment) (with PT)  OT Visit Diagnosis: Other abnormalities of gait and mobility (R26.89);Muscle weakness (generalized) (M62.81)                Time: 8315-1761 OT Time Calculation (min): 17 min Charges:  OT General Charges $OT Visit: 1 Visit OT Evaluation $OT Eval Moderate Complexity: 1 Mod  Jolaine Artist, OT Acute Rehabilitation Services Pager [phone] Office [place] 11/14/2020, 1:33 PM

## 2020-11-14 NOTE — Care Management (Addendum)
Spoke to [facility] with [facility], she is working on authorization for home IVIG infusion.   BCBS requires pulmonary test and PT note. NCM secure chatted MD,OT,and PT.  1500 Received secure chat from MD patient will remain in the hospital to complete IVIG. Pam with [facility] aware.  Magdalen Spatz RN

## 2020-11-14 NOTE — Progress Notes (Addendum)
NEUROLOGY CONSULTATION PROGRESS NOTE   Date of service: November 14, 2020 Patient Name: Veronica [name] MRN:  [id number] DOB:  [birth year]  Brief HPI  Veronica [name] is a 55 y.o. female with PMH significant for  has a past medical history of Hypertension. who presents with 6-7 day hx of muscle weakness, proximal > distal and symmetric.Herneurologic examination is notable for acute symmetric progressiveProximal more than distalmuscle weakness of all extremities over the last few days. In addition, also has subjective pins and needles in finger tips and toes x 3-4 days. She also has absent reflexes in BL lower extremities.   Interval Hx   She reports that she is feeling ok. She reports that her strength has improved. She reports that she still has a headache and pain in her neck. She reports that the weakness in the Right side of her face has improved some. Discussed that we will attempt to set up Home Infusion so she can be discharged. Vitals   Vitals:   11/14/20 0053 11/14/20 0218 11/14/20 0300 11/14/20 0739  BP: 137/89 132/87 (!) 138/96 139/83  Pulse: 79 75 84 72  Resp: _0 Temp: 98.1 F (36.7 C) 97.9 F (36.6 C) 98.2 F (36.8 C) 97.6 F (36.4 C)  TempSrc: Oral Oral Oral Oral  SpO2: 100% 99% 100% 100%  Weight:         There is no height or weight on file to calculate BMI.  Physical Exam   General: Laying comfortably in bed; in no acute distress.  HENT: Normal oropharynx and mucosa. Normal external appearance of ears and nose. Has some swelling in her Right cheek. Neck: Supple, no pain or tenderness  CV: No JVD. No peripheral edema.  Pulmonary: Symmetric Chest rise. Normal respiratory effort.  Abdomen: Soft to touch, non-tender.  Ext: No cyanosis, edema, or deformity  Skin: No rash. Normal palpation of skin.   Musculoskeletal: Normal digits and nails by inspection. No clubbing.   Neurologic Examination  Mental status/Cognition: Alert, oriented, able to follow all  commands Speech/language: Fluent, comprehension intact Cranial nerves:   CN II Pupils equal and reactive to light, no VF deficits    CN III,IV,VI EOM intact, no gaze preference or deviation, no nystagmus    CN V normal sensation in V1, V2, and V3 segments bilaterally    CN VII Right upper and lower facial weakness   CN VIII normal hearing to speech    CN IX & X normal palatal elevation, no uvular deviation    CN XI 5/5 head turn and 5/5 shoulder shrug bilaterally    CN XII midline tongue protrusion     Motor:  Muscle bulk: normal, tone normal. Mvmt Root Nerve  Muscle Right Left Comments  SA C5/6 Ax Deltoid 4+ 4+   EF C5/6 Mc Biceps 5 5   EE C6/7/8 Rad Triceps 5 5   WF C6/7 Med FCR 5 5   WE C7/8 PIN ECU 5 5   F Ab C8/T1 U ADM/FDI 5 5   HF L1/2/3 Fem Illopsoas 4+ 4+   KE L2/3/4 Fem Quad 5 5   DF L4/5 D Peron Tib Ant 5 5   PF S1/2 Tibial Grc/Sol 5 5    Reflexes:  Right Left Comments  Pectoralis      Biceps (C5/6) 2 2   Brachioradialis (C5/6) 2 2    Triceps (C6/7) 2 2    Patellar (L3/4) 0 0    Achilles (S1) 0 0  Hoffman      Plantar mute mute   Jaw jerk     Sensation:  Light touch Intact bilaterally   Pin prick    Temperature    Vibration   Proprioception    Coordination/Complex Motor:  - Finger to Nose intact bilaterally - Heel to shin intact bilaterally - Rapid alternating movement normal - Gait: Deferred  Labs   Basic Metabolic Panel:  Lab Results  Component Value Date   NA 137 11/14/2020   K 3.4 (L) 11/14/2020   CO2 28 11/14/2020   GLUCOSE 109 (H) 11/14/2020   BUN 9 11/14/2020   CREATININE 0.62 11/14/2020   CALCIUM 9.1 11/14/2020   GFRNONAA >60 11/14/2020   GFRAA  06/09/2010    >60        The eGFR has been calculated using the MDRD equation. This calculation has not been validated in all clinical situations. eGFR's persistently <60 mL/min signify possible Chronic Kidney Disease.   HbA1c: No results found for:  HGBA1C LDL: No results found for: [facility] Urine Drug Screen: No results found for: LABOPIA, COCAINSCRNUR, LABBENZ, AMPHETMU, THCU, LABBARB  Alcohol Level No results found for: ETH No results found for: PHENYTOIN, ZONISAMIDE, LAMOTRIGINE, LEVETIRACETA No results found for: PHENYTOIN, PHENOBARB, VALPROATE, CBMZ  Imaging and Diagnostic studies  Results for orders placed during the hospital encounter of 11/11/20  CT Head Wo Contrast  Narrative CLINICAL DATA:  Headache, classic migraine. Planned for lumbar puncture.  EXAM: CT HEAD WITHOUT CONTRAST  TECHNIQUE: Contiguous axial images were obtained from the base of the skull through the vertex without intravenous contrast.  COMPARISON:  No pertinent prior exams available for comparison.  FINDINGS: Brain:  Cerebral volume is normal.  There is no acute intracranial hemorrhage.  No demarcated cortical infarct.  No extra-axial fluid collection.  No evidence of intracranial mass.  No midline shift.  Partially empty sella turcica.  Vascular: No hyperdense vessel. Atherosclerotic calcifications.  Skull: Normal. Negative for fracture or focal lesion.  Sinuses/Orbits: Visualized orbits show no acute finding. Extensive partial opacification of the right frontal sinus. Mild mucosal thickening within the left frontal and left ethmoid sinuses. Mild mucosal thickening and small-volume secretions within the right sphenoid sinus. Mild mucosal thickening within the right greater than left maxillary sinuses at the imaged levels.  IMPRESSION: No evidence of acute intracranial abnormality.  Partially empty sella turcica. This finding is very commonly incidental, but can be associated with idiopathic intracranial hypertension.  Paranasal sinus disease as described. Correlate for acute sinusitis.   Electronically Signed By: Kellie Simmering DO On: 11/11/2020 18:32   Impression   Veronica [name] is a 55 y.o. female with PMH  significant for HTN who presents with acute onset symmetric proximal > distal weakness with pins and needles in BL feet and finger tips. 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 She has started IVIG treatment. She developed Right sided facial droop after her LP on 4/2. MRI brain negative for stroke or demyelination. Her facial droop appears to be improving will continue to monitor. Appears to be Bell's Palsy- question complication of LP (has been reported in the literature). Other differentials would include neurosarcoid, though less likely given no evidence of basal enhancement on brain MRI and only one CN involved.  Will continue with IVIG, received her 3rd dose this morning. Needs PT assessment and PFT. Hope to have Home Infusion set up to allow discharge per Hospitalist Service.  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 Dr. Posey Pronto at [facility]  Neurology will be available as needed. Please call with questions. ______________________________________________________________________   Fatima Sanger MD Resident  Attending Neurohospitalist Addendum Patient seen and examined with APP/Resident. Agree with the history and physical as documented above. Agree with the plan as documented, which I helped formulate. I have independently reviewed the chart, obtained history, review of systems and examined the patient.I have personally reviewed pertinent head/neck/spine imaging (CT/MRI). Please feel free to call with any questions. --- Amie Portland, MD [facility] Pager: [phone]

## 2020-11-14 NOTE — Plan of Care (Signed)

## 2020-11-14 NOTE — Evaluation (Signed)
Physical Therapy Evaluation Patient Details Name: Veronica [name] MRN: [id number] DOB: Mar 08, 1966 Today's Date: 11/14/2020   History of Present Illness  Pt is a 55 y/o female with PMH of HTN, chronic back pain presenting to ED for evaluation of acute onset symmetric proximal to distal weakness, headache, dizziness, muscle pain for 3 days. MRI negative. 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  Clinical Impression   Pt presents with impaired strength, R facial droop, impaired standing balance, impaired gait, and decreased activity tolerance vs baseline. Pt to benefit from acute PT to address deficits. Pt ambulated hallway distance with and without RW, pt performing gait with slowed speed, wide BOS for stability, and high guard position without AD. At baseline, pt works fulltime on [employer] at [employer], pt is far from mobility baseline at this time. PT recommending OPPT with assist from son as needed. PT to progress mobility as tolerated, and will continue to follow acutely.      Follow Up Recommendations Outpatient PT;Supervision for mobility/OOB    Equipment Recommendations  Rolling walker with 5" wheels    Recommendations for Other Services       Precautions / Restrictions Precautions Precautions: Fall Restrictions Weight Bearing Restrictions: No      Mobility  Bed Mobility Overal bed mobility: Modified Independent                  Transfers Overall transfer level: Needs assistance Equipment used: None;Rolling walker (2 wheeled) Transfers: Sit to/from Stand Sit to Stand: Supervision         General transfer comment: supervision for safety, standing without RW on second attempt with increased time to rise and self-steady  Ambulation/Gait Ambulation/Gait assistance: Min guard Gait Distance (Feet): 125 Feet Assistive device: Rolling walker (2 wheeled);None Gait Pattern/deviations: Step-through  pattern;Decreased stride length;Wide base of support Gait velocity: decr   General Gait Details: min guard for safety, initially using RW but increased forward flexion and pt not familiar with use of RW. PT transitioned pt to no AD, pt assuming wider BOS with UEs in more of a high guard position, improved with further gait distance.  Stairs            Wheelchair Mobility    Modified Rankin (Stroke Patients Only)       Balance Overall balance assessment: Needs assistance Sitting-balance support: No upper extremity supported;Feet supported Sitting balance-Leahy Scale: Good     Standing balance support: Bilateral upper extremity supported;No upper extremity supported;During functional activity Standing balance-Leahy Scale: Fair Standing balance comment: preference to UE support, min guard to close supervision dynamically             High level balance activites: Other (comment) High Level Balance Comments: Pt demonstrating unsteadiness and/or increased time with step over object, directional change             Pertinent Vitals/Pain Pain Assessment: Faces Faces Pain Scale: Hurts a little bit Pain Location: low back, with forward truncal flexion in sitting Pain Descriptors / Indicators: Sore;Discomfort Pain Intervention(s): Limited activity within patient's tolerance;Monitored during session;Repositioned    Home Living Family/patient expects to be discharged to:: Private residence Living Arrangements: Children (51 y/o son, in school at [employer]) Available Help at Discharge: Family;Available PRN/intermittently Type of Home: Apartment Home Access: Level entry     Home Layout: Two level;Bed/bath upstairs Home Equipment: None      Prior Function Level of Independence: Independent  Comments: driving and independent, 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     Hand Dominance   Dominant Hand: Right    Extremity/Trunk Assessment   Upper Extremity  Assessment Upper Extremity Assessment: Defer to OT evaluation    Lower Extremity Assessment Lower Extremity Assessment: RLE deficits/detail;LLE deficits/detail RLE Deficits / Details: 2+/5 hip flexion, at least 3/5 hip extension (in sitting), 3+/5 hip abd/add, 3/5 knee extension, 3+/5 knee flexion, full AROM DF/PF (N/T of bilateral feet, midfoot to toes) RLE Sensation: decreased light touch LLE Deficits / Details: 2+/5 hip flexion, at least 3/5 hip extension (in sitting), 3+/5 hip abd/add, 3+/5 knee extension, 3+/5 knee flexion, full AROM DF/PF LLE Sensation: decreased light touch    Cervical / Trunk Assessment Cervical / Trunk Assessment: Normal  Communication   Communication: No difficulties  Cognition Arousal/Alertness: Awake/alert Behavior During Therapy: WFL for tasks assessed/performed Overall Cognitive Status: Within Functional Limits for tasks assessed                                        General Comments      Exercises     Assessment/Plan    PT Assessment Patient needs continued PT services  PT Problem List Decreased strength;Decreased mobility;Decreased activity tolerance;Decreased balance;Decreased knowledge of use of DME;Impaired sensation;Decreased coordination;Decreased safety awareness       PT Treatment Interventions DME instruction;Therapeutic activities;Gait training;Patient/family education;Therapeutic exercise;Balance training;Stair training;Functional mobility training;Neuromuscular re-education    PT Goals (Current goals can be found in the Care Plan section)  Acute Rehab PT Goals Patient Stated Goal: home and to get stronger/back to work PT Goal Formulation: With patient Time For Goal Achievement: 11/28/20 Potential to Achieve Goals: Good    Frequency Min 4X/week   Barriers to discharge        Co-evaluation PT/OT/SLP Co-Evaluation/Treatment: Yes Reason for Co-Treatment: For patient/therapist safety;To address functional/ADL  transfers PT goals addressed during session: Balance;Mobility/safety with mobility OT goals addressed during session: ADL's and self-care       AM-PAC PT "6 Clicks" Mobility  Outcome Measure Help needed turning from your back to your side while in a flat bed without using bedrails?: None Help needed moving from lying on your back to sitting on the side of a flat bed without using bedrails?: None Help needed moving to and from a bed to a chair (including a wheelchair)?: A Little Help needed standing up from a chair using your arms (e.g., wheelchair or bedside chair)?: A Little Help needed to walk in hospital room?: A Little Help needed climbing 3-5 steps with a railing? : A Little 6 Click Score: 20    End of Session Equipment Utilized During Treatment: Gait belt Activity Tolerance: Patient tolerated treatment well;Patient limited by fatigue Patient left: in chair;with chair alarm set;with call bell/phone within reach Nurse Communication: Mobility status PT Visit Diagnosis: Other abnormalities of gait and mobility (R26.89);Difficulty in walking, not elsewhere classified (R26.2)    Time: 7902-4097 PT Time Calculation (min) (ACUTE ONLY): 21 min   Charges:   PT Evaluation $PT Eval Low Complexity: 1 Low         Esgar Barnick S, PT Acute Rehabilitation Services Pager [phone]  Office [phone]   [place] E Ruffin Pyo 11/14/2020, 2:00 PM

## 2020-11-14 NOTE — TOC Initial Note (Signed)
Transition of Care [facility]) - Initial/Assessment Note    Patient Details  Name: Veronica [name] MRN: [id number] Date of Birth: 1966/03/26  Transition of Care [facility]) CM/SW Contact:    Marilu Favre, RN Phone Number: 11/14/2020, 4:06 PM  Clinical Narrative:                  See prior note. Discussed OP PT at [facility] patient in agreement referral sent.   Called for walker and 3 in1   Patient lives with son . Confirmed face sheet information. Expected Discharge Plan: Home/Self Care     Patient Goals and CMS Choice Patient states their goals for this hospitalization and ongoing recovery are:: to return tohome CMS Medicare.gov Compare Post Acute Care list provided to:: Patient    Expected Discharge Plan and Services Expected Discharge Plan: Home/Self Care   Discharge Planning Services: CM Consult   Living arrangements for the past 2 months: Single Family Home                 DME Arranged: 3-N-1,Walker rolling DME Agency: [employer]       HH Arranged: NA          Prior Living Arrangements/Services Living arrangements for the past 2 months: Single Family Home Lives with:: Adult Children Patient language and need for interpreter reviewed:: Yes Do you feel safe going back to the place where you live?: Yes      Need for Family Participation in Patient Care: Yes (Comment) Care giver support system in place?: Yes (comment)   Criminal Activity/Legal Involvement Pertinent to Current Situation/Hospitalization: No - Comment as needed  Activities of Daily Living Home Assistive Devices/Equipment: None ADL Screening (condition at time of admission) Patient's cognitive ability adequate to safely complete daily activities?: Yes Is the patient deaf or have difficulty hearing?: No Does the patient have difficulty seeing, even when wearing glasses/contacts?: No Does the patient have difficulty concentrating, remembering, or making decisions?: No Patient able to express need for  assistance with ADLs?: Yes Does the patient have difficulty dressing or bathing?: No Independently performs ADLs?: Yes (appropriate for developmental age) Does the patient have difficulty walking or climbing stairs?: No Weakness of Legs: None Weakness of Arms/Hands: None  Permission Sought/Granted   Permission granted to share information with : No              Emotional Assessment Appearance:: Appears stated age Attitude/Demeanor/Rapport: Engaged Affect (typically observed): Accepting Orientation: : Oriented to Self,Oriented to Place,Oriented to  Time,Oriented to Situation Alcohol / Substance Use: Not Applicable Psych Involvement: No (comment)  Admission diagnosis:  Neck pain [M54.2] Headache [R51.9] 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 Headache 11/11/2020  . Proximal muscle weakness 11/11/2020  . Essential hypertension 11/11/2020   PCP:  Merrilee Seashore, MD Pharmacy:   [facility] [place], [place] [address] Phone: [phone] Fax: [phone]     Social Determinants of Health (SDOH) Interventions    Readmission Risk Interventions No flowsheet data found.

## 2020-11-15 ENCOUNTER — Encounter: Payer: Self-pay | Admitting: Family Medicine

## 2020-11-15 DIAGNOSIS — M542 Cervicalgia: Secondary | ICD-10-CM | POA: Diagnosis not present

## 2020-11-15 DIAGNOSIS — G610 Guillain-Barre syndrome: Secondary | ICD-10-CM | POA: Diagnosis not present

## 2020-11-15 LAB — CSF CULTURE W GRAM STAIN: Culture: NO GROWTH

## 2020-11-15 LAB — ANGIOTENSIN CONVERTING ENZYME: Angiotensin-Converting Enzyme: 44 U/L (ref 14–82)

## 2020-11-15 NOTE — Progress Notes (Addendum)
NEUROLOGY CONSULTATION PROGRESS NOTE   Date of service: November 15, 2020 Patient Name: Veronica [name] MRN:  [id number] DOB:  09/05/1965  Brief HPI  Veronica [name] is a 55 y.o. female with PMH significant for  has a past medical history of Hypertension. who presents with 6-7 day hx of muscle weakness, proximal > distal and symmetric.Herneurologic examination is notable for acute symmetric progressiveProximal more than distalmuscle weakness of all extremities over the last few days. In addition, also has subjective pins and needles in finger tips and toes x 3-4 days. She also has absent reflexes in BL lower extremities.   Interval Hx   She reports that her headache has improved, while it is still present when she receives her infusion it is much less and the pain has improved as well. She had questions about her diagnosis. Discussed GBS being an autoimmune response from an infection attacking the CNS. Discussed that her facial droop is most likely Bell's Palsy from her LP which while rare can happen and should resolve over there next few months. She had no other concerns at present. Provided patient education of GBS from [employer]. Vitals   Vitals:   11/15/20 0245 11/15/20 0317 11/15/20 0357 11/15/20 0700  BP: (!) 144/93 (!) 131/92 133/88 (!) 142/81  Pulse: 74 77 77 79  Resp: _0 Temp:  97.8 F (36.6 C) 98.1 F (36.7 C) 98.1 F (36.7 C)  TempSrc:  Oral Oral Oral  SpO2: 100% 100% 100% 100%  Weight:         There is no height or weight on file to calculate BMI.  Physical Exam   General: Laying comfortably in bed; in no acute distress.  HENT: Normal oropharynx and mucosa. Normal external appearance of ears and nose.  Neck: Supple, no pain or tenderness  CV: No JVD. No peripheral edema.  Pulmonary: Symmetric Chest rise. Normal respiratory effort.  Abdomen: Soft to touch, non-tender.  Ext: No cyanosis, edema, or deformity  Skin: No rash. Normal palpation of skin.    Musculoskeletal: Normal digits and nails by inspection. No clubbing.   Neurologic Examination  Mental status/Cognition: Alert, oriented to self, place, month and year, good attention. Able to follow all commands. Speech/language: Fluent, comprehension intact. Cranial nerves:   CN II Pupils equal and reactive to light, no VF deficits    CN III,IV,VI EOM intact, no gaze preference or deviation, no nystagmus    CN V normal sensation in V1, V2, and V3 segments bilaterally    CN VII Right sided upper and lower facial droop   CN VIII normal hearing to speech    CN IX & X normal palatal elevation, no uvular deviation    CN XI 5/5 head turn and 5/5 shoulder shrug bilaterally    CN XII midline tongue protrusion    Motor:  Muscle bulk: normal, tone normal, no pronator drift or tremor present Mvmt Root Nerve  Muscle Right Left Comments  SA C5/6 Ax Deltoid 4- 4-   EF C5/6 Mc Biceps 4+ 4+   EE C6/7/8 Rad Triceps 4+ 4+   WF C6/7 Med FCR     WE C7/8 PIN ECU     F Ab C8/T1 U ADM/FDI     HF L1/2/3 Fem Illopsoas 3+ 3+   KE L2/3/4 Fem Quad 4 4   DF L4/5 D Peron Tib Ant 5 5   PF S1/2 Tibial Grc/Sol 5 5     Sensation:  Light touch Intact bilaterally  Pin prick    Temperature    Vibration   Proprioception     Coordination/Complex Motor:  - Finger to Nose intact bilaterally - Heel to shin intact bilaterally - Rapid alternating movement normal - Gait: Deferred  Labs   Basic Metabolic Panel:  Lab Results  Component Value Date   NA 137 11/14/2020   K 3.4 (L) 11/14/2020   CO2 28 11/14/2020   GLUCOSE 109 (H) 11/14/2020   BUN 9 11/14/2020   CREATININE 0.62 11/14/2020   CALCIUM 9.1 11/14/2020   GFRNONAA >60 11/14/2020   GFRAA  06/09/2010    >60        The eGFR has been calculated using the MDRD equation. This calculation has not been validated in all clinical situations. eGFR's persistently <60 mL/min signify possible Chronic Kidney Disease.   HbA1c: No results found for:  HGBA1C LDL: No results found for: [facility] Urine Drug Screen: No results found for: LABOPIA, COCAINSCRNUR, LABBENZ, AMPHETMU, THCU, LABBARB  Alcohol Level No results found for: ETH No results found for: PHENYTOIN, ZONISAMIDE, LAMOTRIGINE, LEVETIRACETA No results found for: PHENYTOIN, PHENOBARB, VALPROATE, CBMZ  Imaging and Diagnostic studies  Results for orders placed during the hospital encounter of 11/11/20  CT Head Wo Contrast  Narrative CLINICAL DATA:  Headache, classic migraine. Planned for lumbar puncture.  EXAM: CT HEAD WITHOUT CONTRAST  TECHNIQUE: Contiguous axial images were obtained from the base of the skull through the vertex without intravenous contrast.  COMPARISON:  No pertinent prior exams available for comparison.  FINDINGS: Brain:  Cerebral volume is normal.  There is no acute intracranial hemorrhage.  No demarcated cortical infarct.  No extra-axial fluid collection.  No evidence of intracranial mass.  No midline shift.  Partially empty sella turcica.  Vascular: No hyperdense vessel. Atherosclerotic calcifications.  Skull: Normal. Negative for fracture or focal lesion.  Sinuses/Orbits: Visualized orbits show no acute finding. Extensive partial opacification of the right frontal sinus. Mild mucosal thickening within the left frontal and left ethmoid sinuses. Mild mucosal thickening and small-volume secretions within the right sphenoid sinus. Mild mucosal thickening within the right greater than left maxillary sinuses at the imaged levels.  IMPRESSION: No evidence of acute intracranial abnormality.  Partially empty sella turcica. This finding is very commonly incidental, but can be associated with idiopathic intracranial hypertension.  Paranasal sinus disease as described. Correlate for acute sinusitis.   Electronically Signed By: Kellie Simmering DO On: 11/11/2020 18:32   Impression   Veronica [name] is a 55 y.o. female with PMH  significant for HTNwho presents withacute onset symmetric proximal > distal weakness with pins and needles in BL feet and finger tips. 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 She has started IVIG treatment. Her exam findings are not much changed except she has a little more weakness in her legs than her arms but could be due to effort because she is fairly tired this morning.  Will continue with IVIG, received her 4rd dose this morning. She should continue to work with PT to increase her strength and they have recommended outpatient PT.   She was started on Prednisone and Valacyclovir yesterday will continue to monitor for signs of improvement in her Right sided facial weakness.  Her CSF results show increased IgG and albumin. Her CSF IgG index is high at 1.0 and her Albumin CSF-mCnc is high at 70. Her protein electrophoresis shows an elevated M-Spike % at 0.1. Her anti-DNA antibody and Antiextractable Nuclear Ag are WNL. Serum ACE was drawn  and awaiting resulting.  Printed and provided a copy of Patient Information of GBS from [employer].   Impression: -GBS -Right Bell's Palsy post LP -Evaluating for Neurosarcoid awaiting ACE result (less likely given no enhancement on MRI)  Recommendations  -Continue IVIG for 5 Days End date 4/7 (Day 3 of 5) -Continue Prednisone for 10 days and Valacyclovir for 10 days for Bell's Palsy (Day 2 of 10) -Await serum ACE -Recommend follow up with outpatient Neurology. Dr. Patel at [place]  [facility] will be available as needed. Please call with questions. ______________________________________________________________________   Alex Pashayan MD Resident  Attending Neurohospitalist Addendum Patient seen and examined with APP/Resident. Agree with the history and physical as documented above. Agree with the plan as documented, which I helped formulate. I have independently reviewed the chart, obtained history, review of systems and  examined the patient.I have personally reviewed pertinent head/neck/spine imaging (CT/MRI). Please feel free to call with any questions. --- Ashish Arora, MD [facility] Pager: [phone]  If 7pm to 7am, please call on call as listed on AMION.   

## 2020-11-15 NOTE — Progress Notes (Signed)
Physical Therapy Treatment Patient Details Name: Veronica [name] MRN: [id number] DOB: 1965/09/02 Today's Date: 11/15/2020    History of Present Illness Pt is a 55 y/o female with PMH of HTN, chronic back pain presenting to ED for evaluation of acute onset symmetric proximal to distal weakness, headache, dizziness, muscle pain for 3 days. MRI negative. 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    PT Comments    Pt performed dynamic gait activities with improved endurance and capacity for dual tasks including head turns with gait, stepping over and around objects, and commands for accelerating and decelerating gait speeds. Pt demonstrates bilateral lower extremity weakness compared to baseline. Pt demonstrates sufficient lower extremity power to negotiate and well  l benefit from strengthening to return to baseline.   Follow Up Recommendations  Outpatient PT;Supervision for mobility/OOB     Equipment Recommendations  None recommended by PT    Recommendations for Other Services       Precautions / Restrictions Precautions Precautions: Fall Restrictions Weight Bearing Restrictions: No    Mobility  Bed Mobility Overal bed mobility: Modified Independent             General bed mobility comments: Pt was recieved and left in recliner at end of session.    Transfers Overall transfer level: Needs assistance Equipment used: None Transfers: Sit to/from Stand Sit to Stand: Supervision         General transfer comment: supervision for safety  Ambulation/Gait Ambulation/Gait assistance: Supervision Gait Distance (Feet): 1000 Feet Assistive device: None Gait Pattern/deviations: Step-through pattern Gait velocity: Functional with encouragement for increased gait speed. Gait velocity interpretation: 1.31 - 2.62 ft/sec, indicative of limited community ambulator General Gait Details: Pt experiences loss of balance with  L head turns. Pt presented slower pivot turns during gait, possibly related to confusion with directions. Pt demonstrated slower speed when asked to step over objects. Pt responded well to stepping around objects.   Stairs Stairs: Yes Stairs assistance: Min guard Stair Management: Alternating pattern;One rail Left Number of Stairs: 10 General stair comments: Pt attempted different methods leading with L foot intially and switching to R foot. Pt turned body diagonally with stair descent.   Wheelchair Mobility    Modified Rankin (Stroke Patients Only)       Balance Overall balance assessment: Needs assistance Sitting-balance support: No upper extremity supported Sitting balance-Leahy Scale: Good     Standing balance support: No upper extremity supported Standing balance-Leahy Scale: Poor               High level balance activites:  (Pt was able to pick object up from the floor with supervision.)              Cognition Arousal/Alertness: Awake/alert Behavior During Therapy: [facility] for tasks assessed/performed Overall Cognitive Status: Within Functional Limits for tasks assessed                                        Exercises General Exercises - Upper Extremity Shoulder Flexion: Strengthening;Both;10 reps;Theraband (2 sets) Theraband Level (Shoulder Flexion): Level 1 (Yellow) Shoulder ABduction: Strengthening;Both;10 reps;Seated;Theraband (2 sets) Theraband Level (Shoulder Abduction): Level 1 (Yellow) Elbow Flexion: Strengthening;Both;10 reps;Seated;Theraband (2 sets) Theraband Level (Elbow Flexion): Level 1 (Yellow) Elbow Extension: Strengthening;Both;10 reps;Seated;Theraband (2 sets) Theraband Level (Elbow Extension): Level 1 (Yellow)    General Comments  Pertinent Vitals/Pain Pain Assessment: No/denies pain Pain Score: 0-No pain Faces Pain Scale: No hurt    Home Living                      Prior Function             PT Goals (current goals can now be found in the care plan section) Acute Rehab PT Goals Patient Stated Goal: home and to get stronger/back to work Progress towards PT goals: Progressing toward goals    Frequency    Min 4X/week      PT Plan Current plan remains appropriate    Co-evaluation              AM-PAC PT "6 Clicks" Mobility   Outcome Measure  Help needed turning from your back to your side while in a flat bed without using bedrails?: None Help needed moving from lying on your back to sitting on the side of a flat bed without using bedrails?: None Help needed moving to and from a bed to a chair (including a wheelchair)?: A Little Help needed standing up from a chair using your arms (e.g., wheelchair or bedside chair)?: A Little Help needed to walk in hospital room?: A Little Help needed climbing 3-5 steps with a railing? : A Little 6 Click Score: 20    End of Session Equipment Utilized During Treatment: Gait belt Activity Tolerance: Patient tolerated treatment well Patient left: in chair;with call bell/phone within reach;with chair alarm set Nurse Communication: Mobility status PT Visit Diagnosis: Other abnormalities of gait and mobility (R26.89);Difficulty in walking, not elsewhere classified (R26.2)     Time: 3662-9476 PT Time Calculation (min) (ACUTE ONLY): 18 min  Charges:  $Gait Training: 8-22 mins                     Zenaida Niece, PT, DPT Acute Rehabilitation Pager: [phone]    Zenaida Niece 11/15/2020, 4:44 PM

## 2020-11-15 NOTE — Progress Notes (Signed)
PROGRESS NOTE   Veronica [name]  JME:[id number] DOB: 04/01/1966 DOA: 11/11/2020 PCP: Merrilee Seashore, MD  Brief Narrative:   55 year old black female from [place] originally has lived here 21 years HTN, neuropathy query cause (on gabapentin) Present 4/1 to urgent care headache dizziness-sent to ED R0 meningitis + Bilateral lower extremity heaviness, cold sensation bilaterally to hands No autoimmune disease MS HIV Neurology consulted by ED ordered  Work-up requested  The patient was seen by me at around 2 PM on 4/2 after her LP performed at around 12 PM and exhibited new findings of right-sided facial droop in addition to upper and motor neuron findings-I discussed and Dr. 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 and she is getting IVIG per Neurology   Hospital-Problem based course  Guillame Barre syndrome Plan per neurology M spike 0.1, IgG is elevated patient is improving slowly IVIG to end 4/7 at the hospital patient given work excuse for 2 weeks after discussion with neurology Dr. Malen Gauze coordinating with Dr. Morrie Sheldon with regards to follow-up at [facility] neurology on discharge Right-sided Bell's palsy Valacyclovir prednisone with taper per neurologist HTN Continue amlodipine 10 for slow blood pressure lower over next several days  DVT prophylaxis: Lovenox Code Status: Full Family Communication:  Disposition:  Status is: Observation  The patient will require care spanning > 2 midnights and should be moved to inpatient because: Persistent severe electrolyte disturbances, Unsafe d/c plan and Inpatient level of care appropriate due to severity of illness  Dispo: The patient is from: Home              Anticipated d/c is to: Home              Patient currently is not medically stable to [employer] IVIG infusion 4/7--needs outpatient therapy 3 and 1 rolling  walker in addition   Difficult to place patient No   Consultants:   Neurology  Procedures: 4/1 MR cervical spine-moderate foraminal stenosis C3-C4, mild foraminal stenosis C4-C5 and C6-C7 CT head 4/1 = partially empty sella turcica  paranasal sinus disease MRI brain 4/2-subcortical periventricular T2 hyperintensities bilaterally?  Chronic microvascular ischemia-demyelinating process (MS, vasculitis, complicated migraines)  Antimicrobials: None   Subjective:  Doing fair Asking for work note Still somewhat weak-states out of 10 with 1 being the worst she feels about a 4 on 10 in terms of recovery and strength Eating and drinking fairly well no chest pain   Objective: Vitals:   11/15/20 0245 11/15/20 0317 11/15/20 0357 11/15/20 0700  BP: (!) 144/93 (!) 131/92 133/88 (!) 142/81  Pulse: 74 77 77 79  Resp: 18 18 17 18   Temp:  97.8 F (36.6 C) 98.1 F (36.7 C) 98.1 F (36.7 C)  TempSrc:  Oral Oral Oral  SpO2: 100% 100% 100% 100%  Weight:        Intake/Output Summary (Last 24 hours) at 11/15/2020 1046 Last data filed at 11/15/2020 2297 Gross per 24 hour  Intake 615 ml  Output --  Net 615 ml   Filed Weights   11/12/20 1831  Weight: 82.5 kg    Examination:  Facial asymmetry persists Right side >left side orbicularis oris weakness persists CTA B Abdomen soft nontender no rebound Power 5/5 right upper, left upper extremity  diminished in the right lower at hip Sensory intact to major dermatomes  Data Reviewed: personally reviewed    Data Results for  MINIYA, MIGUEZ (MRN [id number]) as of 11/13/2020 13:27  Ref. Range 11/12/2020 12:22  Appearance, CSF Latest Ref Range: CLEAR  CLEAR  Glucose, CSF Latest Ref Range: 40 - 70 mg/dL 55  RBC Count, CSF Latest Ref Range: 0 /cu mm 243 (H)  WBC, CSF Latest Ref Range: 0 - 5 /cu mm 2  Other Cells, CSF Unknown TOO FEW TO COUNT, SMEAR AVAILABLE FOR REVIEW  Color, CSF Latest Ref Range: COLORLESS  COLORLESS  Supernatant Unknown NOT  INDICATED  Total  Protein, CSF Latest Ref Range: 15 - 45 mg/dL 169 (H)  Tube # Unknown 3   Gram stain  Cryptococcal antigen [-] HIV [-]  Data  IgG CSF 13.5 CSF IgG index 1.0 IgG/albumin ratio 0.19 IgG total 1.182 No growth on CSF since 4/2  SPEP-- M spike 0.1 Oligoclonal banding pending anti-DNA antibody pending Antiextractable nuclear antigen pending Borrelia burgdorferi antibody pending ACE level is pending   Scheduled Meds: . amLODipine  10 mg Oral QPM  . gabapentin  600 mg Oral QPM  . predniSONE  60 mg Oral QAC breakfast  . valACYclovir  1,000 mg Oral TID   Continuous Infusions: . Immune Globulin 10% Stopped (11/15/20 0241)     LOS: 3 days   Time spent: 15 minutes total  Nita Sells, MD [facility] To contact the attending provider between 7A-7P or the covering provider during after hours 7P-7A, please log into the web site [email or web address] and access using universal [place] password for that web site. If you do not have the password, please call the hospital operator.  11/15/2020, 10:46 AM

## 2020-11-15 NOTE — Plan of Care (Signed)

## 2020-11-15 NOTE — Progress Notes (Signed)
Occupational Therapy Treatment Patient Details Name: Veronica [name] MRN: [id number] DOB: Apr 20, 1966 Today's Date: 11/15/2020    History of present illness Pt is a 55 y/o female with PMH of HTN, chronic back pain presenting to ED for evaluation of acute onset symmetric proximal to distal weakness, headache, dizziness, muscle pain for 3 days. MRI negative. 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   OT comments  Patient eager for OT session.  Provided HEP ([employer]) for UE strength using level 1 theraband, completing x 10 reps 2 sets and educated pt to complete 2-3x day. Discussed home safety/energy conservation and she reports plan to have son assist with driving, cleaning, and planning for ordering meals.  Initated energy conservation for ADLs (figure 4 technique and seated bathing).  Completing transfers, in room mobility and ADLs with supervision.  Good safety awareness.  Will follow.    Follow Up Recommendations  Outpatient OT;Supervision - Intermittent    Equipment Recommendations  3 in 1 bedside commode    Recommendations for Other Services      Precautions / Restrictions Precautions Precautions: Fall Restrictions Weight Bearing Restrictions: No       Mobility Bed Mobility Overal bed mobility: Modified Independent                  Transfers Overall transfer level: Needs assistance Equipment used: Rolling walker (2 wheeled) Transfers: Sit to/from Stand Sit to Stand: Supervision         General transfer comment: supervision for safety    Balance Overall balance assessment: Needs assistance Sitting-balance support: No upper extremity supported;Feet supported Sitting balance-Leahy Scale: Good     Standing balance support: Bilateral upper extremity supported;During functional activity;No upper extremity supported Standing balance-Leahy Scale: Fair                             ADL  either performed or assessed with clinical judgement   ADL Overall ADL's : Needs assistance/impaired     Grooming: Supervision/safety;Standing;Wash/dry hands               Lower Body Dressing: Supervision/safety;Sit to/from stand Lower Body Dressing Details (indicate cue type and reason): initated education on energy conservation techniques Toilet Transfer: Supervision/safety;Ambulation;RW   Toileting- Clothing Manipulation and Hygiene: Modified independent;Sit to/from stand       Functional mobility during ADLs: Supervision/safety;Rolling walker       Vision       Perception     Praxis      Cognition Arousal/Alertness: Awake/alert Behavior During Therapy: WFL for tasks assessed/performed Overall Cognitive Status: Within Functional Limits for tasks assessed                                          Exercises Exercises: General Upper Extremity General Exercises - Upper Extremity Shoulder Flexion: Strengthening;Both;10 reps;Theraband (2 sets) Theraband Level (Shoulder Flexion): Level 1 (Yellow) Shoulder ABduction: Strengthening;Both;10 reps;Seated;Theraband (2 sets) Theraband Level (Shoulder Abduction): Level 1 (Yellow) Elbow Flexion: Strengthening;Both;10 reps;Seated;Theraband (2 sets) Theraband Level (Elbow Flexion): Level 1 (Yellow) Elbow Extension: Strengthening;Both;10 reps;Seated;Theraband (2 sets) Theraband Level (Elbow Extension): Level 1 (Yellow)   Shoulder Instructions       General Comments      Pertinent Vitals/ Pain       Pain Assessment: Faces Faces Pain Scale: No hurt  Home Living                                          Prior Functioning/Environment              Frequency  Min 2X/week        Progress Toward Goals  OT Goals(current goals can now be found in the care plan section)  Progress towards OT goals: Progressing toward goals  Acute Rehab OT Goals Patient Stated Goal: home and to  get stronger/back to work OT Goal Formulation: With patient  Plan Discharge plan remains appropriate;Frequency remains appropriate    Co-evaluation                 AM-PAC OT "6 Clicks" Daily Activity     Outcome Measure   Help from another person eating meals?: None Help from another person taking care of personal grooming?: A Little Help from another person toileting, which includes using toliet, bedpan, or urinal?: A Little Help from another person bathing (including washing, rinsing, drying)?: A Little Help from another person to put on and taking off regular upper body clothing?: A Little Help from another person to put on and taking off regular lower body clothing?: A Little 6 Click Score: 19    End of Session Equipment Utilized During Treatment: Rolling walker  OT Visit Diagnosis: Other abnormalities of gait and mobility (R26.89);Muscle weakness (generalized) (M62.81)   Activity Tolerance Patient tolerated treatment well   Patient Left in chair;with call bell/phone within reach;with chair alarm set   Nurse Communication Mobility status        Time: 3500-9381 OT Time Calculation (min): 24 min  Charges: OT General Charges $OT Visit: 1 Visit OT Treatments $Self Care/Home Management : 8-22 mins $Neuromuscular Re-education: 8-22 mins  Jolaine Artist, OT [place] Pager [phone] Office [phone]    Delight Stare 11/15/2020, 3:51 PM

## 2020-11-16 DIAGNOSIS — D472 Monoclonal gammopathy: Secondary | ICD-10-CM | POA: Diagnosis not present

## 2020-11-16 DIAGNOSIS — G610 Guillain-Barre syndrome: Secondary | ICD-10-CM | POA: Diagnosis not present

## 2020-11-16 DIAGNOSIS — G510 Bell's palsy: Secondary | ICD-10-CM | POA: Diagnosis not present

## 2020-11-16 LAB — OLIGOCLONAL BANDS, CSF + SERM

## 2020-11-16 NOTE — Progress Notes (Addendum)
PROGRESS NOTE        PATIENT DETAILS Name: Veronica [name] Age: 55 y.o. Sex: female Date of Birth: [birth year] Admit Date: 11/11/2020 Admitting Physician Nita Sells, MD QQP:[id number], Mauro Kaufmann, MD  Brief Narrative: Patient is a 55 y.o. female with past medical history of HTN, peripheral neuropathy-presented with headache, dizziness, paresthesias of her hands and feet.  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  Significant events: 4/1>> admit-paresthesias to hands and feet/dizziness 4/2>> developed Bell's palsy after LP-MRI brain negative for CVA  Significant studies: 4/1>> CT head: No acute intracranial abnormality.  Partially empty sella 4/1>> MRI C-spine: No evidence of abnormal cord signal or enhancement. 4/2>> MRI brain: No acute intracranial abnormality 4/1>> SPEP: M spike at 0.1 4/1>> Borrelia burgdorferi antibodies: Pending 4/2>> CSF protein 169, WBC 2-consistent with albumin cytogenic dissociation 4/2>> CSF cryptococcal antigen: Negative 4/2>> oligoclonal bands CSF: Pending 4/4>> ACE level: Normal limits  Antimicrobial therapy: None  Microbiology data: 4/2>> CSF culture: No growth  Procedures : 4/2>> fluoroscopy under lumbar puncture  Consults: Neurology  DVT Prophylaxis : SCDs Start: 11/11/20 1649   Subjective: Right facial weakness unchanged.  Paresthesia in her extremities are better.   Assessment/Plan: Guillain-Barr syndrome: Continue IVIG-day 4/5.  Plans are for outpatient follow-up with Dr. Posey Pronto at [facility] neurology  Right Bell's palsy: Thought to be related to LP-on prednisone/Valtrex x10 days total.  Minimal M spike on protein electrophoresis: ?  [facility]-outpatient hematology follow-up  HTN: BP stable-continue amlodipine  History of peripheral neuropathy: Stable on Neurontin  Diet: Diet Order            Diet Heart Room service appropriate? Yes; Fluid consistency: Thin  Diet effective now                   Code Status: Full code   Family Communication: None at bedside  Disposition Plan: Status is: Inpatient  Remains inpatient appropriate because:Inpatient level of care appropriate due to severity of illness   Dispo: The patient is from: Home              Anticipated d/c is to: Home              Patient currently is not medically stable to d/c.   Difficult to place patient No   Barriers to Discharge: GBS-on IVIG until 4/7  Antimicrobial agents: Anti-infectives (From admission, onward)   Start     Dose/Rate Route Frequency Ordered Stop   11/14/20 1015  valACYclovir (VALTREX) tablet 1,000 mg        1,000 mg Oral 3 times daily 11/14/20 4580 11/24/20 0959       Time spent: 25- minutes-Greater than 50% of this time was spent in counseling, explanation of diagnosis, planning of further management, and coordination of care.  MEDICATIONS: Scheduled Meds: . amLODipine  10 mg Oral QPM  . gabapentin  600 mg Oral QPM  . predniSONE  60 mg Oral QAC breakfast  . valACYclovir  1,000 mg Oral TID   Continuous Infusions: . Immune Globulin 10% Stopped (11/16/20 0227)   PRN Meds:.acetaminophen **OR** acetaminophen, methocarbamol, promethazine   PHYSICAL EXAM: Vital signs: Vitals:   11/16/20 0228 11/16/20 0259 11/16/20 0354 11/16/20 0700  BP: 134/90 140/89 (!) 134/94 (!) 133/93  Pulse: 74 76 76 75  Resp: 18 18 18 18   Temp:  98.6 F (37 C) 98 F (36.7 C) 98.1  F (36.7 C)  TempSrc: Oral Oral Oral Oral  SpO2: 100% 100% 100% 100%  Weight:       Filed Weights   11/12/20 1831  Weight: 82.5 kg   There is no height or weight on file to calculate BMI.   Gen Exam:Alert awake-not in any distress HEENT:atraumatic, normocephalic Chest: B/L clear to auscultation anteriorly CVS:S1S2 regular Abdomen:soft non tender, non distended Extremities:no edema Neurology: Non focal Skin: no rash  I have personally reviewed following labs and imaging studies  LABORATORY  DATA: CBC: Recent Labs  Lab 11/11/20 0945 11/12/20 0054 11/14/20 0600  WBC 4.5 4.9 3.1*  NEUTROABS 2.8  --  1.6*  HGB 12.9 13.4 12.8  HCT 39.1 40.0 38.1  MCV 84.1 83.9 81.6  PLT 478* 468* 431*    Basic Metabolic Panel: Recent Labs  Lab 11/11/20 0945 11/12/20 0054 11/14/20 0600  NA 140 141 137  K 3.8 3.4* 3.4*  CL 109 108 103  CO2 24 25 28   GLUCOSE 107* 97 109*  BUN 6 7 9   CREATININE 0.56 0.61 0.62  CALCIUM 9.2 9.5 9.1  MG 2.0  --   --     GFR: CrCl cannot be calculated (Unknown ideal weight.).  Liver Function Tests: Recent Labs  Lab 11/12/20 0054 11/12/20 1222 11/14/20 0600  AST 29  --  22  ALT 28  --  24  ALKPHOS 77  --  64  BILITOT 0.9  --  0.7  PROT 7.0  --  8.2*  ALBUMIN 4.0 6.0* 3.2*   No results for input(s): LIPASE, AMYLASE in the last 168 hours. No results for input(s): AMMONIA in the last 168 hours.  Coagulation Profile: No results for input(s): INR, PROTIME in the last 168 hours.  Cardiac Enzymes: Recent Labs  Lab 11/11/20 2213  CKTOTAL 247*    BNP (last 3 results) No results for input(s): PROBNP in the last 8760 hours.  Lipid Profile: No results for input(s): CHOL, HDL, LDLCALC, TRIG, CHOLHDL, LDLDIRECT in the last 72 hours.  Thyroid Function Tests: No results for input(s): TSH, T4TOTAL, FREET4, T3FREE, THYROIDAB in the last 72 hours.  Anemia Panel: No results for input(s): VITAMINB12, FOLATE, FERRITIN, TIBC, IRON, RETICCTPCT in the last 72 hours.  Urine analysis: No results found for: COLORURINE, APPEARANCEUR, LABSPEC, PHURINE, GLUCOSEU, HGBUR, BILIRUBINUR, KETONESUR, PROTEINUR, UROBILINOGEN, NITRITE, LEUKOCYTESUR  Sepsis Labs: Lactic Acid, Venous No results found for: LATICACIDVEN  MICROBIOLOGY: Recent Results (from the past 240 hour(s))  CSF culture w Gram Stain     Status: None   Collection Time: 11/12/20 11:30 AM   Specimen: PATH Cytology CSF; Cerebrospinal Fluid  Result Value Ref Range Status   Specimen Description  CSF  Final   Special Requests NONE  Final   Gram Stain   Final    WBC PRESENT, PREDOMINANTLY MONONUCLEAR NO ORGANISMS SEEN CYTOSPIN SMEAR    Culture   Final    NO GROWTH Performed at [facility], [address] [place], [place] [address]    Report Status 11/15/2020 FINAL  Final    RADIOLOGY STUDIES/RESULTS: No results found.   LOS: 4 days   Oren Binet, MD  [facility]    To contact the attending provider between 7A-7P or the covering provider during after hours 7P-7A, please log into the web site [email or web address] and access using universal [place] password for that web site. If you do not have the password, please call the hospital operator.  11/16/2020, 10:43 AM

## 2020-11-16 NOTE — Progress Notes (Signed)
Neurology Progress Note  S: No overnight events. She still has pain at site of LP. She has a squeezing type HA in the posterior cranial/cervical area when IVIG infusing, but not when it is finished. Tylenol relieves. C/o of hard stools, usually goes qod. No issues with urination. She has tingling of her fingers and toes intermittently, but is improving and only waxing and waning now. No numbness or tingling to any other region. Feels her strength is getting better. She does not feel her left facial droop has improved.   PT notes 11/15/20: Pt performed dynamic gait activities with improved endurance and capacity for dual tasks including head turns with gait, stepping over and around objects, and commands for accelerating and decelerating gait speeds. Pt demonstrates bilateral lower extremity weakness compared to baseline. Pt demonstrates sufficient lower extremity power to negotiate and well  l benefit from strengthening to return to baseline.   O: Current vital signs: BP (!) 133/93 (BP Location: Left Arm)   Pulse 75   Temp 98.1 F (36.7 C) (Oral)   Resp 18   Wt 82.5 kg   SpO2 100%  Vital signs in last 24 hours: Temp:  [97.7 F (36.5 C)-98.6 F (37 C)] 98.1 F (36.7 C) (04/06 0700) Pulse Rate:  [70-90] 75 (04/06 0700) Resp:  [16-18] 18 (04/06 0700) BP: (121-152)/(80-94) 133/93 (04/06 0700) SpO2:  [99 %-100 %] 100 % (04/06 0700)  GENERAL: Awake, alert in NAD HEENT: Normocephalic and atraumatic LUNGS: Normal respiratory effort.  CV: RRR. Right foot edema.   ABDOMEN: Soft, nontender Ext: warm, no cyanosis, no ulcer/lesions. Psych: teary when right foot is touched.  NEURO:  Mental Status: AA&Ox3  Speech/Language: speech is without aphasia or dysarthria. Comprehension intact.  Cranial Nerves:  II: PERRL. Visual fields full.  III, IV, VI: EOMI. Eyelids elevate symmetrically.  V: Sensation is intact to light touch and symmetrical to face.  VII: right facial droop.  VIII: hearing  intact to voice. IX, X: Palate elevates symmetrically. Phonation is normal.  XI: Shoulder shrug 5/5. XII: tongue is midline without fasciculations. Motor: Bilateral- Deltoid 4/5    Biceps 4+/5    Triceps 4+/5    Illopsoa 4-/5     Quad 4-/5   Dorsiflexion and Plantar flexion 5/5 Tone: is normal and bulk is normal Sensation- Intact to light touch bilaterally. Extinction absent to light touch to DSS.    Coordination: FTN intact bilaterally, HKS: no ataxia in BLE. No drift.  DTRs: Absent patellar and ankle jerks, triceps +1  brachioradialis +1 bilaterally Gait- deferred  Medications  Current Facility-Administered Medications:  .  acetaminophen (TYLENOL) tablet 650 mg, 650 mg, Oral, Q6H PRN, 650 mg at 11/16/20 0004 **OR** acetaminophen (TYLENOL) suppository 650 mg, 650 mg, Rectal, Q6H PRN, Veronica [name], Ramesh, MD .  amLODipine (NORVASC) tablet 10 mg, 10 mg, Oral, QPM, Veronica [name], Ramesh, MD, 10 mg at 11/15/20 1610 .  gabapentin (NEURONTIN) tablet 600 mg, 600 mg, Oral, QPM, Veronica [name], Ramesh, MD, 600 mg at 11/15/20 1610 .  Immune Globulin 10% (PRIVIGEN) IV infusion 35 g, 400 mg/kg, Intravenous, Q24 Hr x 5, Veronica Simpers, MD, Stopped at 11/16/20 0227 .  methocarbamol (ROBAXIN) tablet 500 mg, 500 mg, Oral, Q6H PRN, Veronica [name], Ramesh, MD .  predniSONE (DELTASONE) tablet 60 mg, 60 mg, Oral, QAC breakfast, [name], Jai-Gurmukh, MD, 60 mg at 11/16/20 0800 .  promethazine (PHENERGAN) tablet 12.5 mg, 12.5 mg, Oral, Q6H PRN, Veronica [name], Ramesh, MD .  valACYclovir (VALTREX) tablet 1,000 mg, 1,000 mg, Oral, TID, [name], Jai-Gurmukh,  MD, 1,000 mg at 11/15/20 2235  Imaging  CTH No evidence of acute intracranial abnormality. Partially empty sella turcica. This finding is very commonly incidental, but can be associated with idiopathic intracranial Hypertension.  Pertinent Labs  Cryptococal antigen negative  Results for Veronica, [name] (MRN [id number]) as of 11/16/2020 09:02  Ref. Range 11/12/2020 12:22  Albumin CSF-mCnc Latest Ref Range:  8 - 37 mg/dL 70 (H)  Appearance, CSF Latest Ref Range: CLEAR  CLEAR  Glucose, CSF Latest Ref Range: 40 - 70 mg/dL 55  RBC Count, CSF Latest Ref Range: 0 /cu mm 243 (H)  WBC, CSF Latest Ref Range: 0 - 5 /cu mm 2  Other Cells, CSF Unknown TOO FEW TO COUNT, SMEAR AVAILABLE FOR REVIEW  Color, CSF Latest Ref Range: COLORLESS  COLORLESS  Supernatant Unknown NOT INDICATED  IgG, CSF Latest Ref Range: 0.0 - 6.7 mg/dL 13.5 (H)  IgG/Alb Ratio, CSF Latest Ref Range: 0.00 - 0.25  0.19  CSF IgG Index Latest Ref Range: 0.0 - 0.7  1.0 (H)  Total  Protein, CSF Latest Ref Range: 15 - 45 mg/dL 169 (H)  Tube # Unknown 3   Impression/plan: 1. GBS-continue IVIG for total of 5 days, ending on 11/17/20. PT recommends out patient PT.  2. Right Bell's palsy post LP-Continue Prednisone and Valacyclovir for 10 days. Educated patient that these medications do not work right away and symptoms may not completely resolve.   3. Awaiting Serum ACE.  4. F/up with out patient neurology, Dr. Posey [name] at [facility] 2 weeks after discharge.    Pt seen by Veronica Boll, MSN, APN-BC/Nurse Practitioner/Neuro and later by MD. Note and plan to be edited as needed by MD.  Pager: [phone]   Attending addendum Patient seen and examined.  Imaging independently reviewed by me. On examination, some improvement in the lower extremity strength. At least 4 -/5 at the hip flexors with preserved distal strength. Today is day 4/5 of IVIG. Complete 5 days of IVIG-last dose 11/17/2020.  Outpatient PT. Agree with the physical documented above which I verified. Agree with the plan above which I helped formulate. Upon discharge, will need follow-up with neuromuscular neurology-Dr. Posey [name]  Discussed my plan with Dr. Sloan [name] over the phone.  -- Veronica Portland, MD Neurologist [facility] Pager: [phone]

## 2020-11-16 NOTE — Progress Notes (Signed)
Physical Therapy Treatment Patient Details Name: Veronica [name] MRN: [id number] DOB: August 01, 1966 Today's Date: 11/16/2020    History of Present Illness Pt is a 55 y/o female with PMH of HTN, chronic back pain presenting to ED for evaluation of acute onset symmetric proximal to distal weakness, headache, dizziness, muscle pain for 3 days. MRI negative. 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    PT Comments    Pt is demonstrating good progress towards her goals. Focused session on advancing her balance and safety with functional mobility, challenging her to navigate stairs leading with either leg with decreased UE support and challenging her to perform tub transfers in prep for d/c home. Pt continues to display moments of minor LOB or unsteadiness, especially when turning her head during gait or leading with her R foot, but also an ability to react appropriately to regain her balance. Will continue to follow acutely. Current recommendations remain appropriate.    Follow Up Recommendations  Outpatient PT;Supervision for mobility/OOB     Equipment Recommendations  None recommended by PT    Recommendations for Other Services       Precautions / Restrictions Precautions Precautions: Fall Restrictions Weight Bearing Restrictions: No    Mobility  Bed Mobility Overal bed mobility: Independent             General bed mobility comments: Pt returned to supine in bed at end of session safely.    Transfers Overall transfer level: Needs assistance Equipment used: None Transfers: Sit to/from Stand Sit to Stand: Supervision         General transfer comment: supervision for safety. Educated pt to hold onto tub wall, side-stepping into/out of tub safely without LOB.  Ambulation/Gait Ambulation/Gait assistance: Supervision Gait Distance (Feet): 500 Feet Assistive device: None Gait Pattern/deviations: Step-through  pattern Gait velocity: Functional with encouragement for increased gait speed. Gait velocity interpretation: 1.31 - 2.62 ft/sec, indicative of limited community ambulator General Gait Details: Pt experiences minor loss of balance with L head turns, taking additional lateral step to regain balance. Pt needing slightly increased time to process cues to start/stop gait when cued. No need for physical assistance as pt able to regain balance with reactional strategies appropriately.   Stairs Stairs: Yes Stairs assistance: Min guard Stair Management: Alternating pattern;One rail Left Number of Stairs: 10 General stair comments: Pt using bil hand rails with reciprocal step pattern initial x2 stairs. Cued to progress to 1 rail usage with reciprocal step. Pt performing step-to pattern when not using any rails. Thus, cued pt to use 2 finger tips support on rails for reciprocal step pattern with success, min guard for safety.   Wheelchair Mobility    Modified Rankin (Stroke Patients Only)       Balance Overall balance assessment: Needs assistance Sitting-balance support: No upper extremity supported Sitting balance-Leahy Scale: Good     Standing balance support: No upper extremity supported Standing balance-Leahy Scale: Fair Standing balance comment: Pt able to reach moderately off BOS without LOB and when she does display a minor LOB she reponds appropriately with reactional strategies.     Tandem Stance - Right Leg: 10 (seconds eyes open no UE support, ~3 seconds with eyes closed before need for UE support to recover LOB) Tandem Stance - Left Leg: 10 (seconds eyes open or closed, no UE support)                    Cognition  Arousal/Alertness: Awake/alert Behavior During Therapy: WFL for tasks assessed/performed Overall Cognitive Status: Within Functional Limits for tasks assessed                                        Exercises Other Exercises Other  Exercises: Step-ups forward and step-downs backwards with 2 finger tip support on rail, 10x leading with each leg    General Comments        Pertinent Vitals/Pain Pain Assessment: Faces Faces Pain Scale: No hurt Pain Intervention(s): Monitored during session    Home Living                      Prior Function            PT Goals (current goals can now be found in the care plan section) Acute Rehab PT Goals Patient Stated Goal: home and to get stronger/back to work PT Goal Formulation: With patient Time For Goal Achievement: 11/28/20 Potential to Achieve Goals: Good Progress towards PT goals: Progressing toward goals    Frequency    Min 4X/week      PT Plan Current plan remains appropriate    Co-evaluation              AM-PAC PT "6 Clicks" Mobility   Outcome Measure  Help needed turning from your back to your side while in a flat bed without using bedrails?: None Help needed moving from lying on your back to sitting on the side of a flat bed without using bedrails?: None Help needed moving to and from a bed to a chair (including a wheelchair)?: A Little Help needed standing up from a chair using your arms (e.g., wheelchair or bedside chair)?: A Little Help needed to walk in hospital room?: A Little Help needed climbing 3-5 steps with a railing? : A Little 6 Click Score: 20    End of Session Equipment Utilized During Treatment: Gait belt Activity Tolerance: Patient tolerated treatment well Patient left: with call bell/phone within reach;in bed;with bed alarm set Nurse Communication: Mobility status PT Visit Diagnosis: Other abnormalities of gait and mobility (R26.89);Difficulty in walking, not elsewhere classified (R26.2)     Time: 9528-4132 PT Time Calculation (min) (ACUTE ONLY): 16 min  Charges:  $Therapeutic Activity: 8-22 mins                     Moishe Spice, PT, DPT Acute Rehabilitation Services  Pager: [phone] Office:  [place] 11/16/2020, 1:31 PM

## 2020-11-17 ENCOUNTER — Telehealth: Payer: Self-pay | Admitting: Hematology and Oncology

## 2020-11-17 ENCOUNTER — Other Ambulatory Visit (HOSPITAL_COMMUNITY): Payer: Self-pay

## 2020-11-17 DIAGNOSIS — D472 Monoclonal gammopathy: Secondary | ICD-10-CM | POA: Diagnosis not present

## 2020-11-17 DIAGNOSIS — G610 Guillain-Barre syndrome: Secondary | ICD-10-CM | POA: Diagnosis not present

## 2020-11-17 MED ORDER — VALACYCLOVIR HCL 1 G PO TABS
1000.0000 mg | ORAL_TABLET | Freq: Three times a day (TID) | ORAL | 0 refills | Status: AC
  Filled 2020-11-17: qty 18, 6d supply, fill #0

## 2020-11-17 MED ORDER — PREDNISONE 10 MG PO TABS
ORAL_TABLET | ORAL | 0 refills | Status: DC
  Filled 2020-11-17: qty 21, 6d supply, fill #0

## 2020-11-17 MED ORDER — PREDNISONE 10 MG PO TABS
ORAL_TABLET | ORAL | 0 refills | Status: DC
  Filled 2020-11-17: qty 15, fill #0

## 2020-11-17 MED ORDER — DOCUSATE SODIUM 100 MG PO CAPS: 200.0000 mg | ORAL_CAPSULE | Freq: Every day | ORAL | Status: DC

## 2020-11-17 NOTE — Telephone Encounter (Signed)
Received a new hem referral from Dr. Sloan Leiter for [facility]us. Pt was seen in the hospital and has been scheduled to see Dr. Chryl Heck on 4/15 at 10am. Letter mailed.

## 2020-11-17 NOTE — TOC Transition Note (Addendum)
Transition of Care [facility] - CM/SW Discharge Note   Patient Details  Name: Veronica [name] MRN: [id number] Date of Birth: Jan 08, 1966  Transition of Care [facility]) CM/SW Contact:  Pollie Friar, RN Phone Number: 11/17/2020, 10:10 AM   Clinical Narrative:    Patient discharging home with outpatient therapy follow up. Pt states she has received the walker and 3 in 1 and her son has taken the DME home. Medications for home to be delivered to the room per [place]. Pt has transportation home.   Final next level of care: OP Rehab Barriers to Discharge: No Barriers Identified   Patient Goals and CMS Choice Patient states their goals for this hospitalization and ongoing recovery are:: to return tohome CMS Medicare.gov Compare Post Acute Care list provided to:: Patient    Discharge Placement                       Discharge Plan and Services   Discharge Planning Services: CM Consult            DME Arranged: 3-N-1,Walker rolling DME Agency: [employer]       HH Arranged: NA          Social Determinants of Health (SDOH) Interventions     Readmission Risk Interventions No flowsheet data found.

## 2020-11-17 NOTE — Discharge Summary (Signed)
PATIENT DETAILS Name: Veronica [name] Age: 55 y.o. Sex: female Date of Birth: [birth year] MRN: [id number]. Admitting Physician: Nita Sells, MD YIR:[id number], Mauro Kaufmann, MD  Admit Date: 11/11/2020 Discharge date: 11/17/2020  Recommendations for Outpatient Follow-up:  1. Follow up with PCP in 1-2 weeks 2. Please obtain CMP/CBC in one week 3. Please follow up with neurology. 4. Borrelia antibodies pending-please follow 5. Please consider outpatient oncology follow-up for minimally elevated M protein on SPEP.  Admitted From:  Home  Disposition: Outpatient PT   Home Health: Yes  Equipment/Devices: None  Discharge Condition: Stable  CODE STATUS: FULL CODE  Diet recommendation:  Diet Order            Diet - low sodium heart healthy           Diet Heart Room service appropriate? Yes; Fluid consistency: Thin  Diet effective now                  Brief Narrative: Patient is a 55 y.o. female with past medical history of HTN, peripheral neuropathy-presented with headache, dizziness, paresthesias of her hands and feet.  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  Significant events: 4/1>> admit-paresthesias to hands and feet/dizziness 4/2>> developed Bell's palsy after LP-MRI brain negative for CVA  Significant studies: 4/1>> CT head: No acute intracranial abnormality.  Partially empty sella 4/1>> MRI C-spine: No evidence of abnormal cord signal or enhancement. 4/2>> MRI brain: No acute intracranial abnormality 4/1>> SPEP: M spike at 0.1 4/1>> Borrelia burgdorferi antibodies: Pending 4/2>> CSF protein 169, WBC 2-consistent with albumin cytogenic dissociation 4/2>> CSF cryptococcal antigen: Negative 4/2>> oligoclonal bands [id number] 4/4>> ACE level: Normal limits  Antimicrobial therapy: None  Microbiology data: 4/2>> CSF culture: No growth  Procedures : 4/2>> fluoroscopy under lumbar puncture  Consults: Neurology  Brief Hospital  Course: Guillain-Barr syndrome:  Treated with IVIG-paresthesias have improved-lower extremity strength has improved.  Completed 5 days of IVIG-Per neurology okay to discharge home today.  Plans are for outpatient follow up with neurology  Right Bell's palsy: Thought to be related to LP-on prednisone/Valtrex x10 days total.  Minimal M spike on protein electrophoresis: ?  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 Unit Evaluation and Dispense as Indicated: No   Call MD for:  extreme fatigue   Complete by: As directed    Call MD for:  persistant dizziness or light-headedness   Complete by: As directed    Diet - low sodium heart healthy   Complete by: As directed    Discharge instructions   Complete by: As directed    Follow with Primary MD  Merrilee Seashore, MD in 1-2 weeks  Please get a complete blood count and chemistry panel checked by your Primary MD at your next visit, and again as instructed  by your Primary MD.  Get Medicines reviewed and adjusted: Please take all your medications with you for your next visit with your Primary MD  Laboratory/radiological data: Please request your Primary MD to go over all hospital tests and procedure/radiological results at the follow up,  please ask your Primary MD to get all Hospital records sent to his/her office.  In some cases, they will be blood work, cultures and biopsy results pending at the time of your discharge. Please request that your primary care M.D. follows up on these results.  Also Note the following: If you experience worsening of your admission symptoms, develop shortness of breath, life threatening emergency, suicidal or homicidal thoughts you must seek medical attention immediately by calling 911 or calling your MD immediately  if symptoms less severe.  You must read complete instructions/literature along with all the possible adverse reactions/side effects for all the Medicines you take and that have been prescribed to you. Take any new Medicines after you have completely understood and accpet all the possible adverse reactions/side effects.   Do not drive when taking Pain medications or sleeping medications (Benzodaizepines)  Do not take more than prescribed Pain, Sleep and Anxiety Medications. It is not advisable to combine anxiety,sleep and pain medications without talking with your primary care practitioner  Special Instructions: If you have smoked or chewed Tobacco  in the last 2 yrs please stop smoking, stop any regular Alcohol  and or any Recreational drug use.  Wear Seat belts while driving.  Please note: You were cared for by a hospitalist during your hospital stay. Once you are discharged, your primary care physician will handle any further medical issues. Please note that NO REFILLS for any discharge medications will be authorized once you are discharged, as it is imperative that you return to your primary care physician (or establish a relationship with a primary care physician if you do not have one) for your post hospital discharge needs so that they can reassess your need for medications and monitor your lab values.   1.)  A electronic referral has been sent to neurology-you should be getting a  call from the office-if you do not hear from them-please give them a call.  2.)  Electronic referral has been sent to oncology (for elevated M spike in protein electrophoresis)-you should be getting a call from them-if you do not hear from them please give them a call.   Increase activity slowly   Complete by: As directed    No dressing needed   Complete by: As directed      Allergies as of 11/17/2020      Reactions   Penicillins Itching, Swelling      Medication List    TAKE these medications   amLODipine 10 MG tablet Commonly known as: NORVASC Take 10 mg by mouth every evening.   gabapentin 300 MG capsule Commonly known as: NEURONTIN Take 600 mg by mouth every evening.   predniSONE 10 MG tablet Commonly known as: DELTASONE Take 60 mg daily for 1 day,50 mg daily for 1 day, 40 mg daily for 1 day, 30 mg daily for 1 day, 20 mg daily for 1 days,10 mg daily for 1 day, then stop   valACYclovir 1000 MG tablet Commonly known as: VALTREX Take 1 tablet (1,000 mg total) by mouth 3 (three) times daily for 6 days.            Durable Medical Equipment  (From admission, onward)  Start     Ordered   11/14/20 1434  For home use only DME 3 n 1  Once        11/14/20 1433   11/14/20 1433  For home use only DME Walker rolling  Once       Question Answer Comment  Walker: With [place] Wheels   Patient needs a walker to treat with the following condition Guillain Barr syndrome [facility])      11/14/20 1433           Discharge Care Instructions  (From admission, onward)         Start     Ordered   11/17/20 0000  No dressing needed        11/17/20 0944          Follow-up Information    [facility] Follow up.   Specialty: Rehabilitation Contact information: [address] [place] [address] [phone]       Merrilee Seashore, MD. Schedule an appointment as soon as possible for a  visit in 1 week(s).   Specialty: Internal Medicine Contact information: [address] [place] [address] [phone]        Narda Amber K, DO Follow up.   Specialty: Neurology Why: office will call-if you do not hear from them please give them a call Contact information: [address] [place] [address] [phone]        [facility] Follow up.   Why: office will call             Allergies  Allergen Reactions  . Penicillins Itching and Swelling     Other Procedures/Studies: CT Head Wo Contrast  Result Date: 11/11/2020 CLINICAL DATA:  Headache, classic migraine. Planned for lumbar puncture. EXAM: CT HEAD WITHOUT CONTRAST TECHNIQUE: Contiguous axial images were obtained from the base of the skull through the vertex without intravenous contrast. COMPARISON:  No pertinent prior exams available for comparison. FINDINGS: Brain: Cerebral volume is normal. There is no acute intracranial hemorrhage. No demarcated cortical infarct. No extra-axial fluid collection. No evidence of intracranial mass. No midline shift. Partially empty sella turcica. Vascular: No hyperdense vessel. Atherosclerotic calcifications. Skull: Normal. Negative for fracture or focal lesion. Sinuses/Orbits: Visualized orbits show no acute finding. Extensive partial opacification of the right frontal sinus. Mild mucosal thickening within the left frontal and left ethmoid sinuses. Mild mucosal thickening and small-volume secretions within the right sphenoid sinus. Mild mucosal thickening within the right greater than left maxillary sinuses at the imaged levels. IMPRESSION: No evidence of acute intracranial abnormality. Partially empty sella turcica. This finding is very commonly incidental, but can be associated with idiopathic intracranial hypertension. Paranasal sinus disease as described. Correlate for acute sinusitis. Electronically Signed   By: Kellie Simmering DO    On: 11/11/2020 18:32   MR BRAIN W WO CONTRAST  Result Date: 11/12/2020 CLINICAL DATA:  Multiple sclerosis. New onset right facial droop. Symmetric bilateral upper and lower extremity weakness. EXAM: MRI HEAD WITHOUT AND WITH CONTRAST TECHNIQUE: Multiplanar, multiecho pulse sequences of the brain and surrounding structures were obtained without and with intravenous contrast. CONTRAST:  29m GADAVIST GADOBUTROL 1 MMOL/ML IV SOLN COMPARISON:  CT head without contrast 11/11/2020 FINDINGS: Brain: Periventricular T2 hyperintensities are mildly advanced for age. No associated enhancement is present. No acute infarct, hemorrhage, or mass lesion is present. Subcortical T2 hyperintensity is noted in the left frontal operculum. The ventricles are  of normal size. Basal ganglia are within normal limits. No significant extraaxial fluid collection is present. The internal auditory canals are within normal limits. The brainstem and cerebellum are within normal limits. Postcontrast images demonstrate no pathologic enhancement. Vascular: Flow is present in the major intracranial arteries. Skull and upper cervical spine: The craniocervical junction is normal. Upper cervical spine is within normal limits. Marrow signal is unremarkable. Sinuses/Orbits: Circumferential mucosal thickening is present the right greater than left frontal sinuses. Mucosal thickening and anterior left ethmoid air cells. Mild mucosal thickening is present in the right sphenoid sinus. No fluid levels are present. The globes and orbits are within normal limits. IMPRESSION: 1. No acute intracranial abnormality. 2. Periventricular and subcortical T2 hyperintensities bilaterally are mildly advanced for age. The finding is nonspecific but can be seen in the setting of chronic microvascular ischemia, a demyelinating process such as multiple sclerosis, vasculitis, complicated migraine headaches, or as the sequelae of a prior infectious or inflammatory process.  Electronically Signed   By: San Morelle M.D.   On: 11/12/2020 16:38   MR CERVICAL SPINE W WO CONTRAST  Result Date: 11/11/2020 CLINICAL DATA:  Evaluate for demyelinating disease. EXAM: MRI CERVICAL SPINE WITHOUT AND WITH CONTRAST TECHNIQUE: Multiplanar and multiecho pulse sequences of the cervical spine, to include the craniocervical junction and cervicothoracic junction, were obtained without and with intravenous contrast. CONTRAST:  15m GADAVIST GADOBUTROL 1 MMOL/ML IV SOLN COMPARISON:  None. FINDINGS: Alignment: Vertebrae: Vertebral body heights are maintained. No specific evidence of acute fracture, discitis/osteomyelitis, or suspicious bone lesion. No abnormal enhancement. Cord: Limited sagittal STIR sequence due to artifact. No evidence of abnormal cord signal on the other sequences, including T2. No syrinx. No abnormal enhancement. Posterior Fossa, vertebral arteries, paraspinal tissues: Negative. Disc levels: C2-C3: No significant disc protrusion, foraminal stenosis, or canal stenosis. C3-C4: Right greater than left uncovertebral hypertrophy with small posterior disc osteophyte complex. Resulting moderate right foraminal stenosis and mild canal stenosis. No significant left foraminal stenosis. C4-C5: Left greater than right uncovertebral hypertrophy. Mild left foraminal stenosis. No significant canal stenosis. C5-C6: Small central disc protrusion which effaces ventral CSF. Bilateral uncovertebral hypertrophy. No significant canal or foraminal stenosis. C6-C7: Right greater than left uncovertebral hypertrophy. Mild right foraminal stenosis. No significant canal stenosis. C7-T1: No significant disc protrusion, foraminal stenosis, or canal stenosis. IMPRESSION: 1. No evidence of abnormal cord signal or enhancement. 2. 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 Mild foraminal stenosis on the left at C4-C5 and the right at C6-C7. Electronically Signed   By: FMargaretha SheffieldMD   On:  11/11/2020 14:17   DG FL GUIDED LUMBAR PUNCTURE  Result Date: 11/12/2020 INDICATION: Headache and neck pain. Failed attempted bedside lumbar puncture. Please perform image guided lumbar puncture for diagnostic purposes. EXAM: FLUOROSCOPIC GUIDED LUMBAR PUNCTURE WITH OPENING AND CLOSING PRESSURES COMPARISON:  Lumbar spine MRI-06/23/2016 MEDICATIONS: None. CONTRAST:  None. FLUOROSCOPY TIME:  5 minutes, 18 seconds (573.4mGy) COMPLICATIONS: None immediate TECHNIQUE: The patient was initially initiated by Dr. DDorise Bullionhowever as it difficulty performing the lumbar puncture and as such I was requested to complete the procedure. Informed written consent was obtained from the patient after a discussion of the risks, benefits and alternatives to treatment. Questions regarding the procedure were encouraged and answered. A timeout was performed prior to the initiation of the procedure. The patient was placed prone, slightly RPO on the fluoroscopy table. The right at L2 - L3 transverse foramina was marked fluoroscopically. The skin overlying the operative site  was prepped and draped in the usual sterile fashion. Local anesthesia was provided with 1% lidocaine. Under intermittent fluoroscopic guidance, a 22 gauge spinal needle was advanced into the spinal canal. Appropriate positioning was confirmed with the efflux of clear CSF. Opening pressure was obtained. Approximately 14 ml of cerebrospinal fluid was collected into four separate containers. Closing pressure was obtained. Samples were sent to the Laboratory as requested per the clinical team. The needle was removed and hemostasis was achieved with manual compression. A dressing was placed. The patient tolerated the above procedure well without immediate postprocedural complication. FINDINGS: Fluoroscopic imaging demonstrates appropriate positioning of the spinal needle within the spinal canal. Successful fluoroscopic lumbar puncture yielding approximately 14 ml of  clear cerebrospinal fluid. Opening pressure - 28 mmHg Closing pressure - 19 mmHg. IMPRESSION: Technically successful fluoroscopic guided lumbar puncture with opening pressure of 28 mmHg and closing pressure of 19 mmHg. Samples were sent to the Laboratory as requested per the clinical team. Electronically Signed   By: Sandi Mariscal M.D.   On: 11/12/2020 12:00     TODAY-DAY OF DISCHARGE:  Subjective:   Veronica [name] today has no headache,no chest abdominal pain,no new weakness tingling or numbness, feels much better wants to go home today.   Objective:   Blood pressure 125/62, pulse 72, temperature 98 F (36.7 C), temperature source Oral, resp. rate 16, weight 82.5 kg, SpO2 99 %.  Intake/Output Summary (Last 24 hours) at 11/17/2020 0947 Last data filed at 11/17/2020 0800 Gross per 24 hour  Intake 480 ml  Output --  Net 480 ml   Filed Weights   11/12/20 1831  Weight: 82.5 kg    Exam: Awake Alert, Oriented *3, No new F.N deficits, Normal affect [place].AT,PERRAL Supple Neck,No JVD, No cervical lymphadenopathy appriciated.  Symmetrical Chest wall movement, Good air movement bilaterally, CTAB RRR,No Gallops,Rubs or new Murmurs, No Parasternal Heave +ve B.Sounds, Abd Soft, Non tender, No organomegaly appriciated, No rebound -guarding or rigidity. No Cyanosis, Clubbing or edema, No new Rash or bruise   PERTINENT RADIOLOGIC STUDIES: No results found.   PERTINENT LAB RESULTS: CBC: No results for input(s): WBC, HGB, HCT, PLT in the last 72 hours. CMET CMP     Component Value Date/Time   NA 137 11/14/2020 0600   K 3.4 (L) 11/14/2020 0600   CL 103 11/14/2020 0600   CO2 28 11/14/2020 0600   GLUCOSE 109 (H) 11/14/2020 0600   BUN 9 11/14/2020 0600   CREATININE 0.62 11/14/2020 0600   CALCIUM 9.1 11/14/2020 0600   PROT 8.2 (H) 11/14/2020 0600   ALBUMIN 3.2 (L) 11/14/2020 0600   ALBUMIN 6.0 (H) 11/12/2020 1222   AST 22 11/14/2020 0600   ALT 24 11/14/2020 0600   ALKPHOS 64 11/14/2020  0600   BILITOT 0.7 11/14/2020 0600   GFRNONAA >60 11/14/2020 0600   GFRAA  06/09/2010 1944    >60        The eGFR has been calculated using the MDRD equation. This calculation has not been validated in all clinical situations. eGFR's persistently <60 mL/min signify possible Chronic Kidney Disease.    GFR CrCl cannot be calculated (Unknown ideal weight.). No results for input(s): LIPASE, AMYLASE in the last 72 hours. No results for input(s): CKTOTAL, CKMB, CKMBINDEX, TROPONINI in the last 72 hours. Invalid input(s): POCBNP No results for input(s): DDIMER in the last 72 hours. No results for input(s): HGBA1C in the last 72 hours. No results for input(s): CHOL, HDL, LDLCALC, TRIG, CHOLHDL, LDLDIRECT in the  last 72 hours. No results for input(s): TSH, T4TOTAL, T3FREE, THYROIDAB in the last 72 hours.  Invalid input(s): FREET3 No results for input(s): VITAMINB12, FOLATE, FERRITIN, TIBC, IRON, RETICCTPCT in the last 72 hours. Coags: No results for input(s): INR in the last 72 hours.  Invalid input(s): PT Microbiology: Recent Results (from the past 240 hour(s))  CSF culture w Gram Stain     Status: None   Collection Time: 11/12/20 11:30 AM   Specimen: PATH Cytology CSF; Cerebrospinal Fluid  Result Value Ref Range Status   Specimen Description CSF  Final   Special Requests NONE  Final   Gram Stain   Final    WBC PRESENT, PREDOMINANTLY MONONUCLEAR NO ORGANISMS SEEN CYTOSPIN SMEAR    Culture   Final    NO GROWTH Performed at [facility], [address] [place], [place] [address]    Report Status 11/15/2020 FINAL  Final    FURTHER DISCHARGE INSTRUCTIONS:  Get Medicines reviewed and adjusted: Please take all your medications with you for your next visit with your Primary MD  Laboratory/radiological data: Please request your Primary MD to go over all hospital tests and procedure/radiological results at the follow up, please ask your Primary MD to get all Hospital  records sent to his/her office.  In some cases, they will be blood work, cultures and biopsy results pending at the time of your discharge. Please request that your primary care M.D. goes through all the records of your hospital data and follows up on these results.  Also Note the following: If you experience worsening of your admission symptoms, develop shortness of breath, life threatening emergency, suicidal or homicidal thoughts you must seek medical attention immediately by calling 911 or calling your MD immediately  if symptoms less severe.  You must read complete instructions/literature along with all the possible adverse reactions/side effects for all the Medicines you take and that have been prescribed to you. Take any new Medicines after you have completely understood and accpet all the possible adverse reactions/side effects.   Do not drive when taking Pain medications or sleeping medications (Benzodaizepines)  Do not take more than prescribed Pain, Sleep and Anxiety Medications. It is not advisable to combine anxiety,sleep and pain medications without talking with your primary care practitioner  Special Instructions: If you have smoked or chewed Tobacco  in the last 2 yrs please stop smoking, stop any regular Alcohol  and or any Recreational drug use.  Wear Seat belts while driving.  Please note: You were cared for by a hospitalist during your hospital stay. Once you are discharged, your primary care physician will handle any further medical issues. Please note that NO REFILLS for any discharge medications will be authorized once you are discharged, as it is imperative that you return to your primary care physician (or establish a relationship with a primary care physician if you do not have one) for your post hospital discharge needs so that they can reassess your need for medications and monitor your lab values.  Total Time spent coordinating discharge including counseling,  education and face to face time equals 35 minutes.  SignedOren Binet 11/17/2020 9:47 AM

## 2020-11-17 NOTE — Progress Notes (Signed)
Neurology Progress Note  S: No overnight events. Has been up walking with PT. F/up recommendations per PT are outpatient PT. States she still gets a HA with IVIG administration, but this is decreased over the last 4 doses. She gets last dose today. She feels stronger. Face is still exhibiting droop. Discussed medications for Bell's Palsy, that she may improve with medications and/or time. And, communicated that in some cases symptoms. No problems with voiding or having BMs except for hard stools. Tingling to fingers and toes comes and goes and is overall decreasing. No other tingling. No improvement in right facial droop.   O: Current vital signs: BP 125/62 (BP Location: Right Arm)   Pulse 72   Temp 98 F (36.7 C) (Oral)   Resp 16   Wt 82.5 kg   SpO2 99%  Vital signs in last 24 hours: Temp:  [97.8 F (36.6 C)-98.5 F (36.9 C)] 98 F (36.7 C) (04/07 0826) Pulse Rate:  [68-84] 72 (04/07 0826) Resp:  [16-19] 16 (04/07 0826) BP: (125-165)/(62-107) 125/62 (04/07 0826) SpO2:  [99 %-100 %] 99 % (04/07 0826)  GENERAL: Awake, alert in NAD. Appears well.  HEENT: Normocephalic and atraumatic LUNGS: Normal respiratory effort.  CV: RRR. No LE edema.   ABDOMEN: Soft, nontender Ext: warm Psych: affect light.   NEURO:  Mental Status: AA&Ox3  Speech/Language: speech is without aphasia or dysarthria. Fluency and comprehension intact.  Cranial Nerves:  II: PERRL.  III, IV, VI: EOMI. Eyelids elevate symmetrically.  V: Sensation is intact to light touch and symmetrical to face.  VII: Right upper and lower facial droop.   VIII: hearing intact to voice. IX, X: Palate elevates symmetrically. Phonation is normal.  BO:FBPZWCHE shrug 5/5. XII: tongue is midline without fasciculations. Motor: right biceps is 4+/5, Bilateral triceps 4/5. LEs 5/5.  Tone: is normal and bulk is normal Sensation- Intact to light touch bilaterally. Extinction absent to light touch to DSS.    Coordination: No drift.   DTRs: BLEs-0    RUE  1+ biceps, triceps,brachioradialis   LUE-1+ triceps 2+ brachioradialis.  Sits up and pivots to side of bed with no difficulty.   Medications  Current Facility-Administered Medications:  .  acetaminophen (TYLENOL) tablet 650 mg, 650 mg, Oral, Q6H PRN, 650 mg at 11/16/20 2321 **OR** acetaminophen (TYLENOL) suppository 650 mg, 650 mg, Rectal, Q6H PRN, Kc, Ramesh, MD .  amLODipine (NORVASC) tablet 10 mg, 10 mg, Oral, QPM, Kc, Ramesh, MD, 10 mg at 11/16/20 1703 .  gabapentin (NEURONTIN) tablet 600 mg, 600 mg, Oral, QPM, Kc, Ramesh, MD, 600 mg at 11/16/20 1702 .  methocarbamol (ROBAXIN) tablet 500 mg, 500 mg, Oral, Q6H PRN, Kc, Ramesh, MD, 500 mg at 11/16/20 1702 .  predniSONE (DELTASONE) tablet 60 mg, 60 mg, Oral, QAC breakfast, Nita Sells, MD, 60 mg at 11/17/20 0837 .  promethazine (PHENERGAN) tablet 12.5 mg, 12.5 mg, Oral, Q6H PRN, Kc, Ramesh, MD .  valACYclovir (VALTREX) tablet 1,000 mg, 1,000 mg, Oral, TID, Verlon Au, Jai-Gurmukh, MD, 1,000 mg at 11/16/20 2306   Assessment and Plan: 1. 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 Improved exam daily. Continue PT, out patient PT on discharge. F/up out patient neurology (Dr. Posey Pronto at [facility]) 2-4 weeks after discharge.  2. 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 Continue Acyclovir and steroids x10 days total. Patient educated on outcomes of treatment.  3. Evaluation for neurosarcoid. ACE 44, normal.  4. Difficulty with BMs (stool is hard). Colace prescribed.   Pt seen by Clance Boll,  MSN, APN-BC/Nurse Practitioner/Neuro and later by MD. Note and plan to be edited as needed by MD.  Pager: [phone]  Attending Neurohospitalist Addendum Patient seen and examined with APP/Resident. Agree with the history and physical as documented above. Agree with the plan as documented, which I helped formulate. I have independently reviewed the chart, obtained history, review of systems and examined the patient.I have personally reviewed  pertinent head/neck/spine imaging (CT/MRI). Please feel free to call with any questions. --- Amie Portland, MD [facility] Pager: [phone]  If 7pm to 7am, please call on call as listed on AMION.

## 2020-11-17 NOTE — Progress Notes (Signed)
Occupational Therapy Treatment Patient Details Name: Veronica [name] MRN: [id number] DOB: [birth year] Today's Date: 11/17/2020    History of present illness Pt is a 55 y/o female with PMH of HTN, chronic back pain presenting to ED for evaluation of acute onset symmetric proximal to distal weakness, headache, dizziness, muscle pain for 3 days. MRI negative. 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   OT comments  Patient educated on tub transfers and use of 3:1 as shower chair for safety, balance and energy conservation; completed tub transfers with supervision given cueing for UE support during transfer.  She reports using theraband for HEP provided last session, upgraded to level 2 band.  Patient progressing well, continue to recommend OP OT for strength, activity tolerance and return to work.     Follow Up Recommendations  Outpatient OT;Supervision - Intermittent    Equipment Recommendations  3 in 1 bedside commode    Recommendations for Other Services      Precautions / Restrictions Precautions Precautions: Fall Restrictions Weight Bearing Restrictions: No       Mobility Bed Mobility Overal bed mobility: Independent                  Transfers Overall transfer level: Needs assistance Equipment used: None Transfers: Sit to/from Stand Sit to Stand: Supervision         General transfer comment: supervision for tub transfers, modified independent for basic transfers sit to stand    Balance Overall balance assessment: Needs assistance Sitting-balance support: No upper extremity supported;Feet supported Sitting balance-Leahy Scale: Good     Standing balance support: No upper extremity supported;During functional activity Standing balance-Leahy Scale: Fair                             ADL either performed or assessed with clinical judgement   ADL Overall ADL's : Needs assistance/impaired              Lower Body Bathing: Modified independent;Sit to/from stand Lower Body Bathing Details (indicate cue type and reason): simulated LB bathing in shower, figure 4 technique for [facility] and safety         Toilet Transfer: Modified Independent;Ambulation       Tub/ Shower Transfer: Tub transfer;Supervision/safety;Ambulation;3 in 1 Tub/Shower Transfer Details (indicate cue type and reason): completed tub transfers using 3:1 given UE support stepping over threshold Functional mobility during ADLs: Supervision/safety       Vision       Perception     Praxis      Cognition Arousal/Alertness: Awake/alert Behavior During Therapy: WFL for tasks assessed/performed Overall Cognitive Status: Within Functional Limits for tasks assessed                                          Exercises     Shoulder Instructions       General Comments pt reports working on HEP provided previously, upgraded and provided level 2 resistance band    Pertinent Vitals/ Pain       Pain Assessment: Faces Faces Pain Scale: No hurt  Home Living  Prior Functioning/Environment              Frequency  Min 2X/week        Progress Toward Goals  OT Goals(current goals can now be found in the care plan section)  Progress towards OT goals: Progressing toward goals  Acute Rehab OT Goals Patient Stated Goal: home and to get stronger/back to work OT Goal Formulation: With patient  Plan Discharge plan remains appropriate;Frequency remains appropriate    Co-evaluation                 AM-PAC OT "6 Clicks" Daily Activity     Outcome Measure   Help from another person eating meals?: None Help from another person taking care of personal grooming?: A Little Help from another person toileting, which includes using toliet, bedpan, or urinal?: None Help from another person bathing (including washing, rinsing,  drying)?: A Little Help from another person to put on and taking off regular upper body clothing?: None Help from another person to put on and taking off regular lower body clothing?: None 6 Click Score: 22    End of Session    OT Visit Diagnosis: Other abnormalities of gait and mobility (R26.89);Muscle weakness (generalized) (M62.81)   Activity Tolerance Patient tolerated treatment well   Patient Left in chair;with call bell/phone within reach   Nurse Communication Mobility status        Time: 2641-5830 OT Time Calculation (min): 8 min  Charges: OT General Charges $OT Visit: 1 Visit OT Treatments $Self Care/Home Management : 8-22 mins  Jolaine Artist, OT [place] Pager [phone] Office [phone]    Delight Stare 11/17/2020, 11:01 AM

## 2020-11-17 NOTE — Progress Notes (Signed)
Pt discharged at this time with all belongings including cell phone and charger, clothing, purse and wallet.  Pt and son verbalize understanding of all discharge instructions, including followup appointments, and medications.  Has TOC meds with her and previously, son took home 3:1 and walker.

## 2020-11-21 ENCOUNTER — Encounter: Payer: Self-pay | Admitting: Neurology

## 2020-11-21 LAB — LYME, WESTERN BLOT, SERUM (REFLEXED)
IgG P18 Ab.: ABSENT
IgG P23 Ab.: ABSENT
IgG P28 Ab.: ABSENT
IgG P30 Ab.: ABSENT
IgG P39 Ab.: ABSENT
IgG P41 Ab.: ABSENT
IgG P45 Ab.: ABSENT
IgG P58 Ab.: ABSENT
IgG P66 Ab.: ABSENT
IgG P93 Ab.: ABSENT
IgM P23 Ab.: ABSENT
IgM P39 Ab.: ABSENT
IgM P41 Ab.: ABSENT
Lyme IgG Wb: NEGATIVE
Lyme IgM Wb: NEGATIVE

## 2020-11-21 LAB — B. BURGDORFI ANTIBODIES: B burgdorferi Ab IgG+IgM: 1.07 {ISR} — ABNORMAL HIGH (ref 0.00–0.90)

## 2020-11-25 ENCOUNTER — Other Ambulatory Visit: Payer: Self-pay

## 2020-11-25 ENCOUNTER — Inpatient Hospital Stay: Payer: BC Managed Care – PPO | Attending: Hematology and Oncology | Admitting: Hematology and Oncology

## 2020-11-25 ENCOUNTER — Encounter: Payer: Self-pay | Admitting: Hematology and Oncology

## 2020-11-25 ENCOUNTER — Telehealth: Payer: Self-pay | Admitting: Hematology and Oncology

## 2020-11-25 ENCOUNTER — Inpatient Hospital Stay: Payer: BC Managed Care – PPO

## 2020-11-25 VITALS — BP 131/81 | HR 85 | Temp 97.7°F | Resp 20 | Ht 64.0 in | Wt 170.1 lb

## 2020-11-25 DIAGNOSIS — D472 Monoclonal gammopathy: Secondary | ICD-10-CM

## 2020-11-25 DIAGNOSIS — G629 Polyneuropathy, unspecified: Secondary | ICD-10-CM | POA: Insufficient documentation

## 2020-11-25 DIAGNOSIS — I10 Essential (primary) hypertension: Secondary | ICD-10-CM | POA: Insufficient documentation

## 2020-11-25 NOTE — Progress Notes (Signed)
[place] NOTE  Patient Care Team: Merrilee Seashore, MD as PCP - General (Internal Medicine)  CHIEF COMPLAINTS/PURPOSE OF CONSULTATION:  MGUS  ASSESSMENT & PLAN:  No problem-specific Assessment & Plan notes found for this encounter.  No orders of the defined types were placed in this encounter.  This is a very pleasant 55 year old female patient with past medical history significant for hypertension who presents with worsening neuropathy and had some work-up done which showed monoclonal protein measuring 0.1 g/dL, referred to hematology for further evaluation.  Patient complains of some muscle weakness mostly in the proximal muscles and numbness in her hands and her feet.  She denies any other complaints.  No other concerning review of systems. Physical examination is unremarkable except for the fact that she needs to walk with a walker and she was slow but she was able to demonstrate reasonable strength. I reviewed her labs which did not show any evidence of anemia, leukopenia could be benign variant.  No elevated creatinine, hypercalcemia.  Mildly elevated total protein. I discussed that MGUS is usually incidental finding when patients are being evaluated for possible peripheral neuropathy.  I recommended additional evaluation with 24-hour UPEP, immunoglobulin levels and kappa lambda ratio and repeat serum protein electrophoresis.  If this is low risk MGUS then she does not need any further evaluation and can be monitored with annual labs.  She also demonstrates some proximal muscle weakness along with her neuropathy hence I do believe she would benefit greatly from a neurology referral with.  I do not entirely believe her symptoms are related to the monoclonal protein. We will follow up with her in 4 weeks to review labs and to discuss additional recommendations.   HISTORY OF PRESENTING ILLNESS:   Veronica [name] 55 y.o. 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  Patient first started noticing some neuropathy about a year ago in her hands and her feet but this has most recently progressed and hence that she seek medical attention.  She has been referred to neurology but is awaiting to be scheduled.  In the meantime she was work-up for multiple sclerosis as well as other causes of peripheral neuropathy and her serum protein electrophoresis showed a monoclonal protein of 2.1 g/dL and hence referred to hematology.  Besides peripheral neuropathy and muscle weakness mostly in the proximal muscles, she denies any other complaints.  No other B symptoms, change in breathing, bowel habits, urinary habits.  No new neurological complaints.  She is walking with a walker currently.  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  No exposure to pesticides.  No dietary restrictions. No family history of hematological disorders. Rest of the pertinent 10 point ROS reviewed and negative.  REVIEW OF SYSTEMS:   Constitutional: Denies fevers, chills or abnormal night sweats Eyes: Denies blurriness of vision, double vision or watery eyes Ears, nose, mouth, throat, and face: Denies mucositis or sore throat Respiratory: Denies cough, dyspnea or wheezes Cardiovascular: Denies palpitation, chest discomfort or lower extremity swelling Gastrointestinal:  Denies nausea, heartburn or change in bowel habits Skin: Denies abnormal skin rashes Lymphatics: Denies new lymphadenopathy or easy bruising Neurological:Denies numbness, tingling or new weaknesses Behavioral/Psych: Mood is stable, no new changes  All other systems were reviewed with the patient and are negative.  MEDICAL HISTORY:  Past Medical History:  Diagnosis Date  . Hypertension     SURGICAL  HISTORY: History reviewed. No pertinent surgical history.  SOCIAL HISTORY: Social History   Socioeconomic History  . Marital status: Single    Spouse name: Not on file  . Number of children: Not on file  . Years of education: Not on file  . Highest education level: Not on file  Occupational History  . Not on file  Tobacco Use  . Smoking status: Never Smoker  . Smokeless tobacco: Never Used  Substance and Sexual Activity  . Alcohol use: Not Currently  . Drug use: Not on file  . Sexual activity: Not on file  Other Topics Concern  . Not on file  Social History Narrative  . Not on file   Social Determinants of Health   Financial Resource Strain: Not on file  Food Insecurity: Not on file  Transportation Needs: Not on file  Physical Activity: Not on file  Stress: Not on file  Social Connections: Not on file  Intimate Partner Violence: Not on file    FAMILY HISTORY: Family History  Family history unknown: Yes    ALLERGIES:  is allergic to penicillins.  MEDICATIONS:  No current outpatient medications on file.   No current facility-administered medications for this visit.     PHYSICAL EXAMINATION:  ECOG PERFORMANCE STATUS: 1 - Symptomatic but completely ambulatory  Vitals:   11/25/20 0957  BP: 131/81  Pulse: 85  Resp: 20  Temp: 97.7 F (36.5 C)  SpO2: 97%   Filed Weights   11/25/20 0957  Weight: 170 lb 1.6 oz (77.2 kg)    GENERAL:alert, no distress and comfortable SKIN: skin color, texture, turgor are normal, no rashes or significant lesions EYES: normal, conjunctiva are pink and non-injected, sclera clear OROPHARYNX:no exudate, no erythema and lips, buccal mucosa, and tongue normal  NECK: supple, thyroid normal size, non-tender, without nodularity LYMPH:  no palpable lymphadenopathy in the cervical, axillary or inguinal LUNGS: clear to auscultation and percussion with normal breathing effort HEART: regular rate & rhythm and no murmurs and no lower  extremity edema ABDOMEN:abdomen soft, non-tender and normal bowel sounds Musculoskeletal:no cyanosis of digits and no clubbing  PSYCH: alert & oriented x 3 with fluent speech NEURO: She does report some proximal muscle weakness but strength seems to be close to normal on exam.  LABORATORY DATA:  I have reviewed the data as listed Lab Results  Component Value Date   WBC 3.1 (L) 11/14/2020   HGB 12.8 11/14/2020   HCT 38.1 11/14/2020   MCV 81.6 11/14/2020   PLT 431 (H) 11/14/2020     Chemistry      Component Value Date/Time   NA 137 11/14/2020 0600   K 3.4 (L) 11/14/2020 0600   CL 103 11/14/2020 0600   CO2 28 11/14/2020 0600   BUN 9 11/14/2020 0600   CREATININE 0.62 11/14/2020 0600      Component Value Date/Time   CALCIUM 9.1 11/14/2020 0600   ALKPHOS 64 11/14/2020 0600   AST 22 11/14/2020 0600   ALT 24 11/14/2020 0600   BILITOT 0.7 11/14/2020 0600       RADIOGRAPHIC STUDIES: I have personally reviewed the radiological images as listed and agreed with the findings in the report. CT Head Wo Contrast  Result Date: 11/11/2020 CLINICAL DATA:  Headache, classic migraine. Planned for lumbar puncture. EXAM: CT HEAD WITHOUT CONTRAST TECHNIQUE: Contiguous axial images were obtained from the base of the skull  through the vertex without intravenous contrast. COMPARISON:  No pertinent prior exams available for comparison. FINDINGS: Brain: Cerebral volume is normal. There is no acute intracranial hemorrhage. No demarcated cortical infarct. No extra-axial fluid collection. No evidence of intracranial mass. No midline shift. Partially empty sella turcica. Vascular: No hyperdense vessel. Atherosclerotic calcifications. Skull: Normal. Negative for fracture or focal lesion. Sinuses/Orbits: Visualized orbits show no acute finding. Extensive partial opacification of the right frontal sinus. Mild mucosal thickening within the left frontal and left ethmoid sinuses. Mild mucosal thickening and  small-volume secretions within the right sphenoid sinus. Mild mucosal thickening within the right greater than left maxillary sinuses at the imaged levels. IMPRESSION: No evidence of acute intracranial abnormality. Partially empty sella turcica. This finding is very commonly incidental, but can be associated with idiopathic intracranial hypertension. Paranasal sinus disease as described. Correlate for acute sinusitis. Electronically Signed   By: Kellie Simmering DO   On: 11/11/2020 18:32   MR BRAIN W WO CONTRAST  Result Date: 11/12/2020 CLINICAL DATA:  Multiple sclerosis. New onset right facial droop. Symmetric bilateral upper and lower extremity weakness. EXAM: MRI HEAD WITHOUT AND WITH CONTRAST TECHNIQUE: Multiplanar, multiecho pulse sequences of the brain and surrounding structures were obtained without and with intravenous contrast. CONTRAST:  72mL GADAVIST GADOBUTROL 1 MMOL/ML IV SOLN COMPARISON:  CT head without contrast 11/11/2020 FINDINGS: Brain: Periventricular T2 hyperintensities are mildly advanced for age. No associated enhancement is present. No acute infarct, hemorrhage, or mass lesion is present. Subcortical T2 hyperintensity is noted in the left frontal operculum. The ventricles are of normal size. Basal ganglia are within normal limits. No significant extraaxial fluid collection is present. The internal auditory canals are within normal limits. The brainstem and cerebellum are within normal limits. Postcontrast images demonstrate no pathologic enhancement. Vascular: Flow is present in the major intracranial arteries. Skull and upper cervical spine: The craniocervical junction is normal. Upper cervical spine is within normal limits. Marrow signal is unremarkable. Sinuses/Orbits: Circumferential mucosal thickening is present the right greater than left frontal sinuses. Mucosal thickening and anterior left ethmoid air cells. Mild mucosal thickening is present in the right sphenoid sinus. No fluid levels  are present. The globes and orbits are within normal limits. IMPRESSION: 1. No acute intracranial abnormality. 2. Periventricular and subcortical T2 hyperintensities bilaterally are mildly advanced for age. The finding is nonspecific but can be seen in the setting of chronic microvascular ischemia, a demyelinating process such as multiple sclerosis, vasculitis, complicated migraine headaches, or as the sequelae of a prior infectious or inflammatory process. Electronically Signed   By: San Morelle M.D.   On: 11/12/2020 16:38   MR CERVICAL SPINE W WO CONTRAST  Result Date: 11/11/2020 CLINICAL DATA:  Evaluate for demyelinating disease. EXAM: MRI CERVICAL SPINE WITHOUT AND WITH CONTRAST TECHNIQUE: Multiplanar and multiecho pulse sequences of the cervical spine, to include the craniocervical junction and cervicothoracic junction, were obtained without and with intravenous contrast. CONTRAST:  65mL GADAVIST GADOBUTROL 1 MMOL/ML IV SOLN COMPARISON:  None. FINDINGS: Alignment: Vertebrae: Vertebral body heights are maintained. No specific evidence of acute fracture, discitis/osteomyelitis, or suspicious bone lesion. No abnormal enhancement. Cord: Limited sagittal STIR sequence due to artifact. No evidence of abnormal cord signal on the other sequences, including T2. No syrinx. No abnormal enhancement. Posterior Fossa, vertebral arteries, paraspinal tissues: Negative. Disc levels: C2-C3: No significant disc protrusion, foraminal stenosis, or canal stenosis. C3-C4: Right greater than left uncovertebral hypertrophy with small posterior disc osteophyte complex. Resulting moderate right foraminal stenosis and mild  canal stenosis. No significant left foraminal stenosis. C4-C5: Left greater than right uncovertebral hypertrophy. Mild left foraminal stenosis. No significant canal stenosis. C5-C6: Small central disc protrusion which effaces ventral CSF. Bilateral uncovertebral hypertrophy. No significant canal or foraminal  stenosis. C6-C7: Right greater than left uncovertebral hypertrophy. Mild right foraminal stenosis. No significant canal stenosis. C7-T1: No significant disc protrusion, foraminal stenosis, or canal stenosis. IMPRESSION: 1. No evidence of abnormal cord signal or enhancement. 2. 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 Mild foraminal stenosis on the left at C4-C5 and the right at C6-C7. Electronically Signed   By: Margaretha Sheffield MD   On: 11/11/2020 14:17   DG FL GUIDED LUMBAR PUNCTURE  Result Date: 11/12/2020 INDICATION: Headache and neck pain. Failed attempted bedside lumbar puncture. Please perform image guided lumbar puncture for diagnostic purposes. EXAM: FLUOROSCOPIC GUIDED LUMBAR PUNCTURE WITH OPENING AND CLOSING PRESSURES COMPARISON:  Lumbar spine MRI-06/23/2016 MEDICATIONS: None. CONTRAST:  None. FLUOROSCOPY TIME:  5 minutes, 18 seconds (65.5 mGy) COMPLICATIONS: None immediate TECHNIQUE: The patient was initially initiated by Dr. Dorise Bullion however as it difficulty performing the lumbar puncture and as such I was requested to complete the procedure. Informed written consent was obtained from the patient after a discussion of the risks, benefits and alternatives to treatment. Questions regarding the procedure were encouraged and answered. A timeout was performed prior to the initiation of the procedure. The patient was placed prone, slightly RPO on the fluoroscopy table. The right at L2 - L3 transverse foramina was marked fluoroscopically. The skin overlying the operative site was prepped and draped in the usual sterile fashion. Local anesthesia was provided with 1% lidocaine. Under intermittent fluoroscopic guidance, a 22 gauge spinal needle was advanced into the spinal canal. Appropriate positioning was confirmed with the efflux of clear CSF. Opening pressure was obtained. Approximately 14 ml of cerebrospinal fluid was collected into four separate containers. Closing pressure was obtained.  Samples were sent to the Laboratory as requested per the clinical team. The needle was removed and hemostasis was achieved with manual compression. A dressing was placed. The patient tolerated the above procedure well without immediate postprocedural complication. FINDINGS: Fluoroscopic imaging demonstrates appropriate positioning of the spinal needle within the spinal canal. Successful fluoroscopic lumbar puncture yielding approximately 14 ml of clear cerebrospinal fluid. Opening pressure - 28 mmHg Closing pressure - 19 mmHg. IMPRESSION: Technically successful fluoroscopic guided lumbar puncture with opening pressure of 28 mmHg and closing pressure of 19 mmHg. Samples were sent to the Laboratory as requested per the clinical team. Electronically Signed   By: Sandi Mariscal M.D.   On: 11/12/2020 12:00   I reviewed the labs.  SPEP showed monoclonal protein measuring 0.1 g/dL, no immunofixation was done.  CBC showed no evidence of hemoglobin.  CMP showed slightly elevated protein, creatinine normal, no evidence of hypercalcemia.  All questions were answered. The patient knows to call the clinic with any problems, questions or concerns. I spent 45 minutes in the care of this patient including H and P, review of records, counseling and coordination of care.     Benay Pike, MD 11/25/2020 10:21 AM

## 2020-11-25 NOTE — Telephone Encounter (Signed)
Scheduled appt per 4/15 sch msg. Pt aware.  

## 2020-11-26 LAB — IGG, IGA, IGM
IgA: 186 mg/dL (ref 87–352)
IgG (Immunoglobin G), Serum: 2782 mg/dL — ABNORMAL HIGH (ref 586–1602)
IgM (Immunoglobulin M), Srm: 123 mg/dL (ref 26–217)

## 2020-11-28 LAB — KAPPA/LAMBDA LIGHT CHAINS
Kappa free light chain: 12.6 mg/L (ref 3.3–19.4)
Kappa, lambda light chain ratio: 1.08 (ref 0.26–1.65)
Lambda free light chains: 11.7 mg/L (ref 5.7–26.3)

## 2020-11-29 ENCOUNTER — Other Ambulatory Visit: Payer: Self-pay

## 2020-11-29 ENCOUNTER — Inpatient Hospital Stay: Payer: BC Managed Care – PPO

## 2020-11-29 DIAGNOSIS — D472 Monoclonal gammopathy: Secondary | ICD-10-CM

## 2020-11-29 DIAGNOSIS — G629 Polyneuropathy, unspecified: Secondary | ICD-10-CM | POA: Diagnosis not present

## 2020-11-29 DIAGNOSIS — I10 Essential (primary) hypertension: Secondary | ICD-10-CM | POA: Diagnosis not present

## 2020-12-01 DIAGNOSIS — R5383 Other fatigue: Secondary | ICD-10-CM | POA: Diagnosis not present

## 2020-12-01 DIAGNOSIS — G610 Guillain-Barre syndrome: Secondary | ICD-10-CM | POA: Diagnosis not present

## 2020-12-01 DIAGNOSIS — R531 Weakness: Secondary | ICD-10-CM | POA: Diagnosis not present

## 2020-12-01 LAB — UPEP/UIFE/LIGHT CHAINS/TP, 24-HR UR
% BETA, Urine: 0 %
ALPHA 1 URINE: 0 %
Albumin, U: 100 %
Alpha 2, Urine: 0 %
Free Kappa Lt Chains,Ur: 3.66 mg/L (ref 1.17–86.46)
Free Kappa/Lambda Ratio: >5.46 (ref 1.83–14.26)
Free Lambda Lt Chains,Ur: <0.67 mg/L (ref 0.27–15.21)
GAMMA GLOBULIN URINE: 0 %
Total Protein, Urine-Ur/day: 120 mg/24 hr (ref 30–150)
Total Protein, Urine: 4.3 mg/dL
Total Volume: 2800

## 2020-12-01 LAB — PROTEIN ELECTROPHORESIS, SERUM, WITH REFLEX
A/G Ratio: 0.7 (ref 0.7–1.7)
Albumin ELP: 3.4 g/dL (ref 2.9–4.4)
Alpha-1-Globulin: 0.3 g/dL (ref 0.0–0.4)
Alpha-2-Globulin: 0.7 g/dL (ref 0.4–1.0)
Beta Globulin: 1.2 g/dL (ref 0.7–1.3)
Gamma Globulin: 2.4 g/dL — ABNORMAL HIGH (ref 0.4–1.8)
Globulin, Total: 4.6 g/dL — ABNORMAL HIGH (ref 2.2–3.9)
M-Spike, %: 0.2 g/dL — ABNORMAL HIGH
Total Protein ELP: 8 g/dL (ref 6.0–8.5)

## 2020-12-15 ENCOUNTER — Encounter: Payer: Self-pay | Admitting: Neurology

## 2020-12-15 ENCOUNTER — Ambulatory Visit: Payer: BC Managed Care – PPO | Admitting: Neurology

## 2020-12-15 ENCOUNTER — Other Ambulatory Visit: Payer: Self-pay

## 2020-12-15 VITALS — BP 127/74 | HR 70 | Wt 174.0 lb

## 2020-12-15 DIAGNOSIS — G610 Guillain-Barre syndrome: Secondary | ICD-10-CM

## 2020-12-15 MED ORDER — GABAPENTIN 300 MG PO CAPS: ORAL_CAPSULE | ORAL | 5 refills | Status: DC

## 2020-12-15 NOTE — Patient Instructions (Signed)
Start gabapentin 300mg  at bedtime for one week, then increase to 1 tablet twice daily.  Call with update in 6 weeks  Return to clinic in 3 months

## 2020-12-15 NOTE — Progress Notes (Signed)
[facility] Clinic Note - Initial Visit   Date: 12/15/20  Veronica [name] MRN: [id number] DOB: 07-07-1966   Dear Dr. Sloan Leiter:  Thank you for your kind referral of [name] for consultation of Guillain-Barre syndrome. Although her history is well known to you, please allow [place] to reiterate it for the purpose of our medical record. The patient was accompanied to the clinic by son who also provides collateral information.     History of Present Illness: Veronica [name] is a 55 y.o. female originally from [place] with hypertension presenting for evaluation of Guillain Barre Syndrome. She was hospitalized at [facility] with 3-4 day history of headache, leg weakness, and paresthesias of the hands and feet where CSF testing was consistent with Guillain Barre Syndrome (protein 169, WBC 2). She developed right upper and lower facial weakness for which MRI brain was ordered and negative for stroke. She was treated with IVIG 2g/kg and responded well. For Bell's palsy, she was given acyclovir and prednisone. PT skilled her for out-patient PT.  She is still waiting to start PT.  Since returning home, she has been doing well. She has numbness/tingling in the hands and legs, especially with prolonged standing. Her strength has improved in the hands and legs.  Prior to her admission, she was working as a Secretary/administrator at [employer].  Out-side paper records, electronic medical record, and images have been reviewed where available and summarized as:  MRI Brain with and without contrast: 1. No acute intracranial abnormality. 2. Periventricular and subcortical T2 hyperintensities bilaterally are mildly advanced for age. The finding is nonspecific but can be seen in the setting of chronic microvascular ischemia, a demyelinating process such as multiple sclerosis, vasculitis, complicated migraine headaches, or as the sequelae of a prior infectious or inflammatory process.  MRI C spine  with and without contrast: 1. No evidence of abnormal cord signal or enhancement. 2. 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 Mild foraminal stenosis on the left at C4-C5 and the right at C6-C7.  CSF Studies: Tube #  3   Color, CSF COLORLESS COLORLESS   Appearance, CSF CLEAR CLEAR   Supernatant  NOT INDICATED   RBC Count, CSF 0 /cu mm 243High   WBC, CSF 0 - 5 /cu mm 2   Other Cells, CSF  TOO FEW TO COUNT, SMEAR AVAILABLE FOR REVIEW     Ref Range & Units 12:22  Total Protein, CSF 15 - 45 mg/dL 169High    Glucose, CSF 40 - 70 mg/dL 55    Specimen Description CSF   Special Requests NONE   Gram Stain WBC PRESENT, PREDOMINANTLY MONONUCLEAR  NO ORGANISMS SEEN  CYTOSPIN SMEAR  Performed at [facility] [address] [place], [place] [address]   Culture PENDING   Report Status PENDING       No results found for: HGBA1C Lab Results  Component Value Date   VITAMINB12 1,346 (H) 11/11/2020   Lab Results  Component Value Date   TSH 0.940 06/08/2010   No results found for: ESRSEDRATE, POCTSEDRATE  Past Medical History:  Diagnosis Date  . Hypertension     History reviewed. No pertinent surgical history.   Medications:  Outpatient Encounter Medications as of 12/15/2020  Medication Sig  . gabapentin (NEURONTIN) 300 MG capsule Take 1 tablet at bedtime x 1 week, then increase to 1 tablet in the morning and 1 tablet at bedtime.  Marland Kitchen amLODipine (NORVASC) 10 MG tablet Take 1 tablet by mouth daily.  No facility-administered encounter medications on file as of 12/15/2020.    Allergies:  Allergies  Allergen Reactions  . Penicillins Itching and Swelling    Family History: Family History  Family history unknown: Yes    Social History: Social History   Tobacco Use  . Smoking status: Never Smoker  . Smokeless tobacco: Never Used  Substance Use Topics  . Alcohol use: Never  . Drug use: Never   Social History   Social History Narrative    Pt living with her son.   Right hand    Vital Signs:  BP 127/74   Pulse 70   Wt 174 lb (78.9 kg)   SpO2 96%   BMI 29.87 kg/m   Neurological Exam: MENTAL STATUS including orientation to time, place, person, recent and remote memory, attention span and concentration, language, and fund of knowledge is normal.  Speech is not dysarthric.  CRANIAL NERVES: II:  No visual field defects.  III-IV-VI: Pupils equal round and reactive to light.  Normal conjugate, extra-ocular eye movements in all directions of gaze.  No nystagmus.  No ptosis.   V:  Normal facial sensation.    VII:  Normal facial symmetry and movements.  Facial droop is not appreciated. VIII:  Normal hearing and vestibular function.   IX-X:  Normal palatal movement.   XI:  Normal shoulder shrug and head rotation.   XII:  Normal tongue strength and range of motion, no deviation or fasciculation.  MOTOR:  No atrophy, fasciculations or abnormal movements.  No pronator drift.   Upper Extremity:  Right  Left  Deltoid  5/5   5/5   Biceps  5/5   5/5   Triceps  5/5   5/5   Infraspinatus 5/5  5/5  Medial pectoralis 5/5  5/5  Wrist extensors  5/5   5/5   Wrist flexors  5/5   5/5   Finger extensors  5/5   5/5   Finger flexors  5/5   5/5   Dorsal interossei  5/5   5/5   Abductor pollicis  5/5   5/5   Tone (Ashworth scale)  0  0   Lower Extremity:  Right  Left  Hip flexors  5/5   5/5   Hip extensors  5/5   5/5   Adductor 5/5  5/5  Abductor 5/5  5/5  Knee flexors  5/5   5/5   Knee extensors  5/5   5/5   Dorsiflexors  5/5   5/5   Plantarflexors  5/5   5/5   Toe extensors  5/5   5/5   Toe flexors  5/5   5/5   Tone (Ashworth scale)  0  0   MSRs:  Right        Left                  brachioradialis 2+  2+  biceps 2+  2+  triceps 1+  1+  patellar 0  0  ankle jerk 0  0  Hoffman no  no  plantar response down  down   SENSORY:  Vibration is mildly reduced at the ankles, intact at the knees and hands.  Temperature and  pin prick is normal throughout.  Romberg's sign absent.   COORDINATION/GAIT: Normal finger-to- nose-finger.  Intact rapid alternating movements bilaterally. Gait narrow based and stable. Tandem and stressed gait intact.    IMPRESSION: Guillain Barre syndrome (diagnosed 11/2020) manifesting with weakness and paresthesias.  She responded  to IVIG and continues to have paresthesias of the arms and legs. There is no motor weakness on exam.  I discussed that sensory symptoms typically take much longer to improve.  It is very reassuring that symptoms have not got worse, nor has she developed any new symptoms.   PLAN/RECOMMENDATIONS:  Start gabapentin 300mg  at bedtime x 1 week, then increase to BID for painful paresthesias Start PT for balance training If paresthesias persist, NCS/EMG of the arm and leg can be performed to guide prognosis.  At this time, it will not change management  Return to clinic in 3 months.    Thank you for allowing me to participate in patient's care.  If I can answer any additional questions, I would be pleased to do so.    Sincerely,    Abby Stines K. Posey Pronto, DO

## 2020-12-22 ENCOUNTER — Telehealth: Payer: Self-pay

## 2020-12-22 ENCOUNTER — Encounter: Payer: Self-pay | Admitting: Hematology and Oncology

## 2020-12-22 ENCOUNTER — Telehealth: Payer: Self-pay | Admitting: Hematology and Oncology

## 2020-12-22 ENCOUNTER — Other Ambulatory Visit: Payer: Self-pay | Admitting: Hematology and Oncology

## 2020-12-22 ENCOUNTER — Other Ambulatory Visit: Payer: Self-pay

## 2020-12-22 ENCOUNTER — Inpatient Hospital Stay: Payer: BC Managed Care – PPO | Attending: Hematology and Oncology | Admitting: Hematology and Oncology

## 2020-12-22 VITALS — BP 135/85 | HR 64 | Temp 97.3°F | Resp 18 | Wt 174.1 lb

## 2020-12-22 DIAGNOSIS — D472 Monoclonal gammopathy: Secondary | ICD-10-CM

## 2020-12-22 NOTE — Telephone Encounter (Signed)
Scheduled follow-up appointments per 5/12 los. Patient is aware. 

## 2020-12-22 NOTE — Assessment & Plan Note (Signed)
MGUS, IFE not done for unclear reason,  Normal K/L ratio, normal UPEP, Ig G elevated.  No evidence of end organ damage, We will try to run it by [facility] to see why IFE hasnt been done. If this was missed, we may have to ask the patient to come back for SPEP with reflex to IFE At this time, band is faint, and there is no over high risk criteria, so follow up was recommended with RTC in 6 months. She is agreeable to these recommendations

## 2020-12-22 NOTE — Telephone Encounter (Signed)
Patient called and notified that Dr. Chryl [name] would like her to have her labs redrawn. Patient aware and understands that someone will contact her to set up the appointment.  Scheduling message sent.  Patient had no further questions at conclusion of phone call.

## 2020-12-22 NOTE — Progress Notes (Signed)
[name] FOLLOW UP NOTE  Patient Care Team: Merrilee Seashore, MD as PCP - General (Internal Medicine)  CHIEF COMPLAINTS/PURPOSE OF CONSULTATION:  Follow up on labs for MGUS  ASSESSMENT & PLAN:  MGUS (monoclonal gammopathy of unknown significance) MGUS, IFE not done for unclear reason,  Normal K/L ratio, normal UPEP, Ig G elevated.  No evidence of end organ damage, We will try to run it by [facility] to see why IFE hasnt been done. If this was missed, we may have to ask the patient to come back for SPEP with reflex to IFE At this time, band is faint, and there is no over high risk criteria, so follow up was recommended with RTC in 6 months. She is agreeable to these recommendations   Orders Placed This Encounter  Procedures  . CBC with Differential/Platelet    Standing Status:   Standing    Number of Occurrences:   22    Standing Expiration Date:   12/22/2021  . CMP ([place] only)    Standing Status:   Future    Standing Expiration Date:   12/22/2021  . SPEP with reflex to IFE    Standing Status:   Future    Standing Expiration Date:   12/22/2021  . Kappa/lambda light chains    Standing Status:   Future    Standing Expiration Date:   12/22/2021  . IgG, IgA, IgM    Standing Status:   Future    Standing Expiration Date:   12/22/2021     HISTORY OF PRESENTING ILLNESS:  Veronica [name] 55 y.o. 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  INTERIM HISTORY  Patient is here for a follow-up with her son.  She is doing quite well compared to her last visit.  Her strength in her lower extremities has significantly improved.  No B symptoms.  No new bone pains.  No change in breathing, bowel habits or urinary habits.  No new neurological complaints.  She is still on gabapentin and amlodipine. Rest of the pertinent 10 point ROS reviewed and negative  REVIEW OF SYSTEMS:   Constitutional: Denies fevers, chills or abnormal night sweats Eyes: Denies blurriness of  vision, double vision or watery eyes Ears, nose, mouth, throat, and face: Denies mucositis or sore throat Respiratory: Denies cough, dyspnea or wheezes Cardiovascular: Denies palpitation, chest discomfort or lower extremity swelling Gastrointestinal:  Denies nausea, heartburn or change in bowel habits Skin: Denies abnormal skin rashes Lymphatics: Denies new lymphadenopathy or easy bruising Neurological:Denies numbness, tingling or new weaknesses Behavioral/Psych: Mood is stable, no new changes  All other systems were reviewed with the patient and are negative.   MEDICAL HISTORY:  Past Medical History:  Diagnosis Date  . Hypertension     SURGICAL HISTORY: No past surgical history on file.  SOCIAL HISTORY: Social History   Socioeconomic History  . Marital status: Single    Spouse name: Not on file  . Number of children: Not on file  . Years of education: Not on file  . Highest education level: Not on file  Occupational History  . Not on file  Tobacco Use  . Smoking status: Never Smoker  . Smokeless tobacco: Never Used  Substance and Sexual Activity  . Alcohol use: Never  . Drug use: Never  . Sexual activity: Not on file  Other Topics Concern  . Not on file  Social History Narrative   Pt living with her son.   Right hand  Social Determinants of Health   Financial Resource Strain: Not on file  Food Insecurity: Not on file  Transportation Needs: Not on file  Physical Activity: Not on file  Stress: Not on file  Social Connections: Not on file  Intimate Partner Violence: Not on file    FAMILY HISTORY: Family History  Family history unknown: Yes    ALLERGIES:  is allergic to penicillins.  MEDICATIONS:  Current Outpatient Medications  Medication Sig Dispense Refill  . amLODipine (NORVASC) 10 MG tablet Take 1 tablet by mouth daily.    Marland Kitchen gabapentin (NEURONTIN) 300 MG capsule Take 1 tablet at bedtime x 1 week, then increase to 1 tablet in the morning and 1  tablet at bedtime. 60 capsule 5   No current facility-administered medications for this visit.    PHYSICAL EXAMINATION: ECOG PERFORMANCE STATUS: 0 - Asymptomatic  Vitals:   12/22/20 1317  BP: 135/85  Pulse: 64  Resp: 18  Temp: (!) 97.3 F (36.3 C)  SpO2: 100%   Filed Weights   12/22/20 1317  Weight: 174 lb 1.6 oz (79 kg)    GENERAL:alert, no distress and comfortable SKIN: skin color, texture, turgor are normal, no rashes or significant lesions EYES: normal, conjunctiva are pink and non-injected, sclera clear OROPHARYNX:no exudate, no erythema and lips, buccal mucosa, and tongue normal  NECK: supple, thyroid normal size, non-tender, without nodularity LYMPH:  no palpable lymphadenopathy in the cervical, axillary or inguinal LUNGS: clear to auscultation and percussion with normal breathing effort HEART: regular rate & rhythm and no murmurs and no lower extremity edema ABDOMEN:abdomen soft, non-tender and normal bowel sounds Musculoskeletal:no cyanosis of digits and no clubbing  PSYCH: alert & oriented x 3 with fluent speech NEURO: no focal motor/sensory deficits  LABORATORY DATA:  I have reviewed the data as listed Lab Results  Component Value Date   WBC 3.1 (L) 11/14/2020   HGB 12.8 11/14/2020   HCT 38.1 11/14/2020   MCV 81.6 11/14/2020   PLT 431 (H) 11/14/2020     Chemistry      Component Value Date/Time   NA 137 11/14/2020 0600   K 3.4 (L) 11/14/2020 0600   CL 103 11/14/2020 0600   CO2 28 11/14/2020 0600   BUN 9 11/14/2020 0600   CREATININE 0.62 11/14/2020 0600      Component Value Date/Time   CALCIUM 9.1 11/14/2020 0600   ALKPHOS 64 11/14/2020 0600   AST 22 11/14/2020 0600   ALT 24 11/14/2020 0600   BILITOT 0.7 11/14/2020 0600     Labs reviewed.  UPEP normal.  Kappa lambda ratio normal.  IgG showed elevated IgG.  SPEP showed monoclonal protein of 0.2 g/dL CBC without any evidence of anemia. Slightly elevated total protein at 8.2 g/dL.  Normal  calcium, creatinine and alkaline phosphatase.  RADIOGRAPHIC STUDIES: I have personally reviewed the radiological images as listed and agreed with the findings in the report. No results found.  All questions were answered. The patient knows to call the clinic with any problems, questions or concerns.     Benay Pike, MD 12/22/2020 2:20 PM

## 2020-12-23 ENCOUNTER — Telehealth: Payer: Self-pay | Admitting: Hematology and Oncology

## 2020-12-23 NOTE — Telephone Encounter (Signed)
Scheduled appt per 5/12 sch msg. Pt aware.  

## 2020-12-26 ENCOUNTER — Other Ambulatory Visit: Payer: Self-pay

## 2020-12-26 ENCOUNTER — Inpatient Hospital Stay: Payer: BC Managed Care – PPO

## 2020-12-26 DIAGNOSIS — D472 Monoclonal gammopathy: Secondary | ICD-10-CM

## 2020-12-26 LAB — CBC WITH DIFFERENTIAL/PLATELET
Abs Immature Granulocytes: 0.02 10*3/uL (ref 0.00–0.07)
Basophils Absolute: 0 10*3/uL (ref 0.0–0.1)
Basophils Relative: 1 %
Eosinophils Absolute: 0.1 10*3/uL (ref 0.0–0.5)
Eosinophils Relative: 2 %
HCT: 36.3 % (ref 36.0–46.0)
Hemoglobin: 12.2 g/dL (ref 12.0–15.0)
Immature Granulocytes: 0 %
Lymphocytes Relative: 33 %
Lymphs Abs: 1.6 10*3/uL (ref 0.7–4.0)
MCH: 28.2 pg (ref 26.0–34.0)
MCHC: 33.6 g/dL (ref 30.0–36.0)
MCV: 83.8 fL (ref 80.0–100.0)
Monocytes Absolute: 0.4 10*3/uL (ref 0.1–1.0)
Monocytes Relative: 8 %
Neutro Abs: 2.8 10*3/uL (ref 1.7–7.7)
Neutrophils Relative %: 56 %
Platelets: 369 10*3/uL (ref 150–400)
RBC: 4.33 MIL/uL (ref 3.87–5.11)
RDW: 13.5 % (ref 11.5–15.5)
WBC: 5 10*3/uL (ref 4.0–10.5)
nRBC: 0 % (ref 0.0–0.2)

## 2020-12-26 LAB — CMP (CANCER CENTER ONLY)
ALT: 19 U/L (ref 0–44)
AST: 22 U/L (ref 15–41)
Albumin: 3.8 g/dL (ref 3.5–5.0)
Alkaline Phosphatase: 99 U/L (ref 38–126)
Anion gap: 10 (ref 5–15)
BUN: 9 mg/dL (ref 6–20)
CO2: 25 mmol/L (ref 22–32)
Calcium: 9.4 mg/dL (ref 8.9–10.3)
Chloride: 106 mmol/L (ref 98–111)
Creatinine: 0.68 mg/dL (ref 0.44–1.00)
GFR, Estimated: >60 mL/min (ref >60)
Glucose, Bld: 104 mg/dL — ABNORMAL HIGH (ref 70–99)
Potassium: 4 mmol/L (ref 3.5–5.1)
Sodium: 141 mmol/L (ref 135–145)
Total Bilirubin: 0.2 mg/dL — ABNORMAL LOW (ref 0.3–1.2)
Total Protein: 7.5 g/dL (ref 6.5–8.1)

## 2020-12-27 LAB — IGG, IGA, IGM
IgA: 155 mg/dL (ref 87–352)
IgG (Immunoglobin G), Serum: 1391 mg/dL (ref 586–1602)
IgM (Immunoglobulin M), Srm: 104 mg/dL (ref 26–217)

## 2020-12-27 LAB — KAPPA/LAMBDA LIGHT CHAINS
Kappa free light chain: 13.5 mg/L (ref 3.3–19.4)
Kappa, lambda light chain ratio: 1.13 (ref 0.26–1.65)
Lambda free light chains: 12 mg/L (ref 5.7–26.3)

## 2020-12-30 LAB — IMMUNOFIXATION REFLEX, SERUM
IgA: 162 mg/dL (ref 87–352)
IgA: 163 mg/dL (ref 87–352)
IgG (Immunoglobin G), Serum: 1462 mg/dL (ref 586–1602)
IgG (Immunoglobin G), Serum: 1492 mg/dL (ref 586–1602)
IgM (Immunoglobulin M), Srm: 106 mg/dL (ref 26–217)
IgM (Immunoglobulin M), Srm: 109 mg/dL (ref 26–217)

## 2020-12-30 LAB — PROTEIN ELECTROPHORESIS, SERUM, WITH REFLEX
A/G Ratio: 1.2 (ref 0.7–1.7)
A/G Ratio: 1.3 (ref 0.7–1.7)
Albumin ELP: 3.8 g/dL (ref 2.9–4.4)
Albumin ELP: 3.9 g/dL (ref 2.9–4.4)
Alpha-1-Globulin: 0.2 g/dL (ref 0.0–0.4)
Alpha-1-Globulin: 0.2 g/dL (ref 0.0–0.4)
Alpha-2-Globulin: 0.6 g/dL (ref 0.4–1.0)
Alpha-2-Globulin: 0.6 g/dL (ref 0.4–1.0)
Beta Globulin: 0.9 g/dL (ref 0.7–1.3)
Beta Globulin: 1 g/dL (ref 0.7–1.3)
Gamma Globulin: 1.3 g/dL (ref 0.4–1.8)
Gamma Globulin: 1.4 g/dL (ref 0.4–1.8)
Globulin, Total: 3 g/dL (ref 2.2–3.9)
Globulin, Total: 3.2 g/dL (ref 2.2–3.9)
M-Spike, %: 0.1 g/dL — ABNORMAL HIGH
M-Spike, %: 0.1 g/dL — ABNORMAL HIGH
SPEP Interpretation: 0
SPEP Interpretation: 0
Total Protein ELP: 6.9 g/dL (ref 6.0–8.5)
Total Protein ELP: 7 g/dL (ref 6.0–8.5)

## 2021-01-23 DIAGNOSIS — M545 Low back pain, unspecified: Secondary | ICD-10-CM | POA: Diagnosis not present

## 2021-01-23 DIAGNOSIS — G610 Guillain-Barre syndrome: Secondary | ICD-10-CM | POA: Diagnosis not present

## 2021-01-23 DIAGNOSIS — R531 Weakness: Secondary | ICD-10-CM | POA: Diagnosis not present

## 2021-02-01 DIAGNOSIS — G610 Guillain-Barre syndrome: Secondary | ICD-10-CM | POA: Diagnosis not present

## 2021-02-06 DIAGNOSIS — M545 Low back pain, unspecified: Secondary | ICD-10-CM | POA: Diagnosis not present

## 2021-02-06 DIAGNOSIS — R531 Weakness: Secondary | ICD-10-CM | POA: Diagnosis not present

## 2021-02-06 DIAGNOSIS — G610 Guillain-Barre syndrome: Secondary | ICD-10-CM | POA: Diagnosis not present

## 2021-02-08 DIAGNOSIS — R600 Localized edema: Secondary | ICD-10-CM | POA: Diagnosis not present

## 2021-02-08 DIAGNOSIS — I10 Essential (primary) hypertension: Secondary | ICD-10-CM | POA: Diagnosis not present

## 2021-02-08 DIAGNOSIS — R55 Syncope and collapse: Secondary | ICD-10-CM | POA: Diagnosis not present

## 2021-02-08 DIAGNOSIS — G610 Guillain-Barre syndrome: Secondary | ICD-10-CM | POA: Diagnosis not present

## 2021-02-10 ENCOUNTER — Ambulatory Visit: Payer: BC Managed Care – PPO | Admitting: Neurology

## 2021-03-20 ENCOUNTER — Ambulatory Visit: Payer: BC Managed Care – PPO | Admitting: Neurology

## 2021-03-20 ENCOUNTER — Other Ambulatory Visit: Payer: Self-pay

## 2021-03-20 VITALS — BP 126/81 | HR 71 | Ht 65.0 in | Wt 181.6 lb

## 2021-03-20 DIAGNOSIS — G610 Guillain-Barre syndrome: Secondary | ICD-10-CM

## 2021-03-20 NOTE — Patient Instructions (Addendum)
Reduce to gabapentin '300mg'$  to 1 tablet for one week, then stop.  If your tingling gets worse, to can restart it  Return to clinic in 6 months

## 2021-03-20 NOTE — Progress Notes (Signed)
Follow-up Visit   Date: 03/20/21   Veronica [name] MRN: [id number] DOB: 1966-07-23   Interim History: Veronica [name] is a 55 y.o.  female originally from [place] with hypertension returning to the clinic for follow-up of Guillain Barre syndrome.  The patient was accompanied to the clinic by self.  History of present illness: She was hospitalized at [facility] with 3-4 day history of headache, leg weakness, and paresthesias of the hands and feet where CSF testing was consistent with Guillain Barre Syndrome (protein 169, WBC 2). She developed right upper and lower facial weakness for which MRI brain was ordered and negative for stroke. She was treated with IVIG 2g/kg and responded well. For Bell's palsy, she was given acyclovir and prednisone. PT skilled her for out-patient PT.  She is still waiting to start PT.  Since returning home, she has been doing well. She has numbness/tingling in the hands and legs, especially with prolonged standing. Her strength has improved in the hands and legs.  Prior to her admission, she was working as a Secretary/administrator at [employer].  UPDATE 03/20/2021:  She is here for follow-up visit.  She has been doing very well over the past few months and returned to work at [employer] in July.  Her numbness/tingling has significantly improved and now she only has intermittent spells in the left hand about 2-3 times per week.  She denies numbness/tingling in the right hand or feet.  No weakness, imbalance, or falls.   Medications:  Current Outpatient Medications on File Prior to Visit  Medication Sig Dispense Refill   amLODipine (NORVASC) 10 MG tablet Take 1 tablet by mouth daily.     No current facility-administered medications on file prior to visit.    Allergies:  Allergies  Allergen Reactions   Penicillins Itching and Swelling    Vital Signs:  BP 126/81 (BP Location: Left Arm, Patient Position: Sitting, Cuff Size: Small)   Pulse 71   Ht '5\' 5"'$  (1.651 m)   Wt 181 lb  9.6 oz (82.4 kg)   SpO2 96%   BMI 30.22 kg/m   Neurological Exam: MENTAL STATUS including orientation to time, place, person, recent and remote memory, attention span and concentration, language, and fund of knowledge is normal.  Speech is not dysarthric.  CRANIAL NERVES:  No visual field defects.  Pupils equal round and reactive to light.  Normal conjugate, extra-ocular eye movements in all directions of gaze.  No ptosis   MOTOR:  Motor strength is 5/5 in all extremities.  No atrophy, fasciculations or abnormal movements.  No pronator drift.  Tone is normal.    MSRs:  Reflexes are 2+/4 throughout in the arms, absent in the legs.  SENSORY:  Intact to vibration throughout, including distally in the feet.  COORDINATION/GAIT:    Gait narrow based and stable. Stressed and tandem gait intact.  Data: MRI Brain with and without contrast: 1. No acute intracranial abnormality. 2. Periventricular and subcortical T2 hyperintensities bilaterally are mildly advanced for age. The finding is nonspecific but can be seen in the setting of chronic microvascular ischemia, a demyelinating process such as multiple sclerosis, vasculitis, complicated migraine headaches, or as the sequelae of a prior infectious or inflammatory process.   MRI C spine with and without contrast: 1. No evidence of abnormal cord signal or enhancement. 2. 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 Mild foraminal stenosis on the left at C4-C5 and the right at C6-C7.   CSF Studies: Tube #  3  Color, CSF COLORLESS COLORLESS  Appearance, CSF CLEAR CLEAR  Supernatant   NOT INDICATED  RBC Count, CSF 0 /cu mm 243 High   WBC, CSF 0 - 5 /cu mm 2  Other Cells, CSF   TOO FEW TO COUNT, SMEAR AVAILABLE FOR REVIEW      Ref Range & Units 12:22  Total  Protein, CSF 15 - 45 mg/dL 169 High     Glucose, CSF 40 - 70 mg/dL 55    Specimen Description CSF  Special Requests NONE  Gram Stain WBC PRESENT, PREDOMINANTLY MONONUCLEAR   NO ORGANISMS SEEN  CYTOSPIN SMEAR  Performed at [facility] [address] [place], [place] [address]       IMPRESSION/PLAN: Guillain-Barre syndrome, diagnosed 11/2020, manifesting with weakness and paresthesias, treated with IVIG.  She is doing remarkably well with only intermittent paresthesias involving the left hand.  On her exam she remains arreflexic in the legs, however, vibratory sensation has returned in the feet.    I recommend that we taper her gabapentin to '300mg'$  at bedtime x 1 week, then stop.  If symptoms return, she may restart the medication.   Return to clinic in 6 months.   Thank you for allowing me to participate in patient's care.  If I can answer any additional questions, I would be pleased to do so.    Sincerely,    Tudor Chandley K. Posey Pronto, DO

## 2021-03-23 ENCOUNTER — Encounter: Payer: Self-pay | Admitting: Hematology and Oncology

## 2021-04-19 DIAGNOSIS — I10 Essential (primary) hypertension: Secondary | ICD-10-CM | POA: Diagnosis not present

## 2021-04-19 DIAGNOSIS — G610 Guillain-Barre syndrome: Secondary | ICD-10-CM | POA: Diagnosis not present

## 2021-04-19 DIAGNOSIS — D472 Monoclonal gammopathy: Secondary | ICD-10-CM | POA: Diagnosis not present

## 2021-06-05 ENCOUNTER — Telehealth: Payer: Self-pay | Admitting: Hematology and Oncology

## 2021-06-05 NOTE — Telephone Encounter (Signed)
Rescheduled 11/10 appointment due to provider pal, called and left a voicemail.

## 2021-06-15 ENCOUNTER — Other Ambulatory Visit: Payer: BC Managed Care – PPO

## 2021-06-19 ENCOUNTER — Other Ambulatory Visit: Payer: Self-pay

## 2021-06-19 ENCOUNTER — Inpatient Hospital Stay: Payer: BC Managed Care – PPO | Attending: Hematology and Oncology

## 2021-06-19 DIAGNOSIS — D472 Monoclonal gammopathy: Secondary | ICD-10-CM | POA: Insufficient documentation

## 2021-06-19 DIAGNOSIS — R232 Flushing: Secondary | ICD-10-CM | POA: Diagnosis not present

## 2021-06-19 DIAGNOSIS — M545 Low back pain, unspecified: Secondary | ICD-10-CM | POA: Diagnosis not present

## 2021-06-19 DIAGNOSIS — L659 Nonscarring hair loss, unspecified: Secondary | ICD-10-CM | POA: Insufficient documentation

## 2021-06-19 DIAGNOSIS — G8929 Other chronic pain: Secondary | ICD-10-CM | POA: Diagnosis not present

## 2021-06-19 LAB — CBC WITH DIFFERENTIAL/PLATELET
Abs Immature Granulocytes: 0.02 10*3/uL (ref 0.00–0.07)
Basophils Absolute: 0.1 10*3/uL (ref 0.0–0.1)
Basophils Relative: 1 %
Eosinophils Absolute: 0.1 10*3/uL (ref 0.0–0.5)
Eosinophils Relative: 2 %
HCT: 38.5 % (ref 36.0–46.0)
Hemoglobin: 12.7 g/dL (ref 12.0–15.0)
Immature Granulocytes: 0 %
Lymphocytes Relative: 41 %
Lymphs Abs: 2 10*3/uL (ref 0.7–4.0)
MCH: 27.4 pg (ref 26.0–34.0)
MCHC: 33 g/dL (ref 30.0–36.0)
MCV: 83 fL (ref 80.0–100.0)
Monocytes Absolute: 0.4 10*3/uL (ref 0.1–1.0)
Monocytes Relative: 9 %
Neutro Abs: 2.3 10*3/uL (ref 1.7–7.7)
Neutrophils Relative %: 47 %
Platelets: 340 10*3/uL (ref 150–400)
RBC: 4.64 MIL/uL (ref 3.87–5.11)
RDW: 12.8 % (ref 11.5–15.5)
WBC: 4.8 10*3/uL (ref 4.0–10.5)
nRBC: 0 % (ref 0.0–0.2)

## 2021-06-22 ENCOUNTER — Ambulatory Visit: Payer: BC Managed Care – PPO | Admitting: Hematology and Oncology

## 2021-06-22 ENCOUNTER — Other Ambulatory Visit: Payer: Self-pay | Admitting: Hematology and Oncology

## 2021-06-22 ENCOUNTER — Other Ambulatory Visit: Payer: BC Managed Care – PPO

## 2021-06-22 DIAGNOSIS — D472 Monoclonal gammopathy: Secondary | ICD-10-CM

## 2021-06-22 NOTE — Progress Notes (Signed)
Rescheduled

## 2021-06-26 DIAGNOSIS — I10 Essential (primary) hypertension: Secondary | ICD-10-CM | POA: Diagnosis not present

## 2021-06-26 DIAGNOSIS — D472 Monoclonal gammopathy: Secondary | ICD-10-CM | POA: Diagnosis not present

## 2021-06-26 DIAGNOSIS — G610 Guillain-Barre syndrome: Secondary | ICD-10-CM | POA: Diagnosis not present

## 2021-06-28 ENCOUNTER — Ambulatory Visit (HOSPITAL_COMMUNITY)
Admission: RE | Admit: 2021-06-28 | Discharge: 2021-06-28 | Disposition: A | Discharge status: Home / Self Care | Payer: BC Managed Care – PPO | Source: Ambulatory Visit | Attending: Hematology and Oncology | Admitting: Hematology and Oncology

## 2021-06-28 ENCOUNTER — Inpatient Hospital Stay: Payer: BC Managed Care – PPO | Admitting: Physician Assistant

## 2021-06-28 ENCOUNTER — Inpatient Hospital Stay: Payer: BC Managed Care – PPO

## 2021-06-28 ENCOUNTER — Other Ambulatory Visit: Payer: Self-pay

## 2021-06-28 VITALS — BP 154/92 | HR 71 | Temp 97.5°F | Resp 20 | Wt 178.5 lb

## 2021-06-28 DIAGNOSIS — D472 Monoclonal gammopathy: Secondary | ICD-10-CM

## 2021-06-28 DIAGNOSIS — G8929 Other chronic pain: Secondary | ICD-10-CM | POA: Diagnosis not present

## 2021-06-28 DIAGNOSIS — M545 Low back pain, unspecified: Secondary | ICD-10-CM | POA: Diagnosis not present

## 2021-06-28 DIAGNOSIS — R232 Flushing: Secondary | ICD-10-CM | POA: Diagnosis not present

## 2021-06-28 DIAGNOSIS — L659 Nonscarring hair loss, unspecified: Secondary | ICD-10-CM | POA: Diagnosis not present

## 2021-06-28 LAB — CBC WITH DIFFERENTIAL (CANCER CENTER ONLY)
Abs Immature Granulocytes: 0 10*3/uL (ref 0.00–0.07)
Basophils Absolute: 0 10*3/uL (ref 0.0–0.1)
Basophils Relative: 1 %
Eosinophils Absolute: 0.1 10*3/uL (ref 0.0–0.5)
Eosinophils Relative: 2 %
HCT: 38.3 % (ref 36.0–46.0)
Hemoglobin: 12.7 g/dL (ref 12.0–15.0)
Immature Granulocytes: 0 %
Lymphocytes Relative: 43 %
Lymphs Abs: 2.3 10*3/uL (ref 0.7–4.0)
MCH: 27.6 pg (ref 26.0–34.0)
MCHC: 33.2 g/dL (ref 30.0–36.0)
MCV: 83.3 fL (ref 80.0–100.0)
Monocytes Absolute: 0.4 10*3/uL (ref 0.1–1.0)
Monocytes Relative: 7 %
Neutro Abs: 2.5 10*3/uL (ref 1.7–7.7)
Neutrophils Relative %: 47 %
Platelet Count: 378 10*3/uL (ref 150–400)
RBC: 4.6 MIL/uL (ref 3.87–5.11)
RDW: 13.2 % (ref 11.5–15.5)
WBC Count: 5.2 10*3/uL (ref 4.0–10.5)
nRBC: 0 % (ref 0.0–0.2)

## 2021-06-28 LAB — CMP (CANCER CENTER ONLY)
ALT: 20 U/L (ref 0–44)
AST: 19 U/L (ref 15–41)
Albumin: 4.2 g/dL (ref 3.5–5.0)
Alkaline Phosphatase: 105 U/L (ref 38–126)
Anion gap: 10 (ref 5–15)
BUN: 11 mg/dL (ref 6–20)
CO2: 26 mmol/L (ref 22–32)
Calcium: 9.7 mg/dL (ref 8.9–10.3)
Chloride: 107 mmol/L (ref 98–111)
Creatinine: 0.87 mg/dL (ref 0.44–1.00)
GFR, Estimated: >60 mL/min (ref >60)
Glucose, Bld: 88 mg/dL (ref 70–99)
Potassium: 3.8 mmol/L (ref 3.5–5.1)
Sodium: 143 mmol/L (ref 135–145)
Total Bilirubin: 0.2 mg/dL — ABNORMAL LOW (ref 0.3–1.2)
Total Protein: 7.3 g/dL (ref 6.5–8.1)

## 2021-06-28 LAB — LACTATE DEHYDROGENASE: LDH: 256 U/L — ABNORMAL HIGH (ref 98–192)

## 2021-06-28 IMAGING — DX DG BONE SURVEY MET
8 of 10 series · 8 of 10 positions shown · non-contrast
Comparison: Previous imaging studies as distant as [C5]

CLINICAL DATA: Monoclonal gammopathy.  Question lytic lesions.

EXAM:
METASTATIC BONE SURVEY

[skull lat]
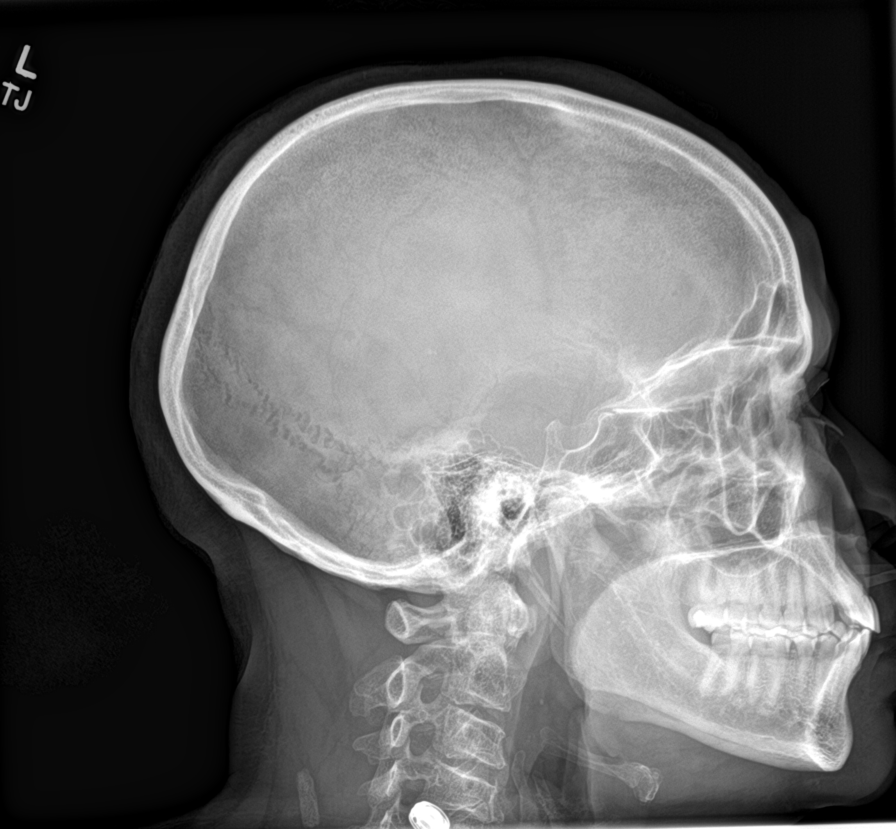

[shoulder ap (1 of 2)]
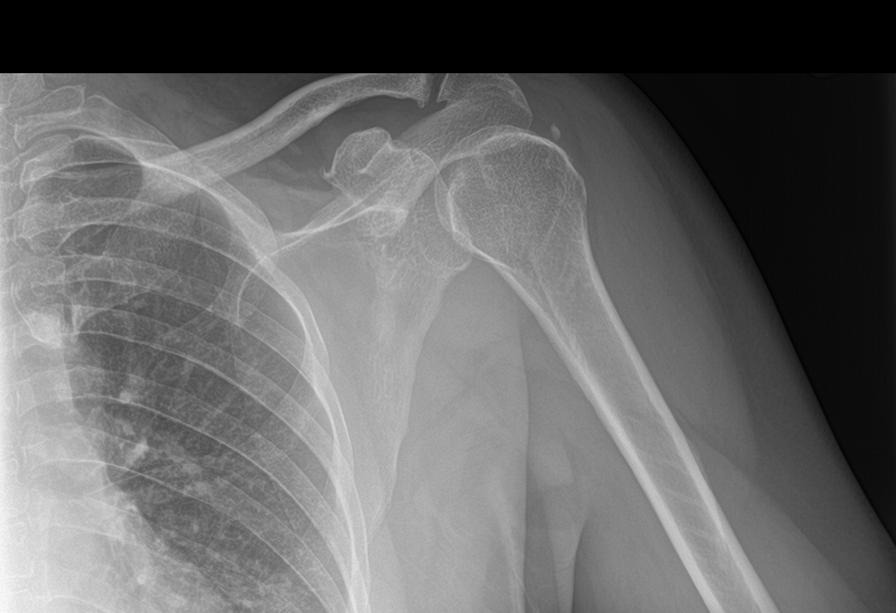

[shoulder ap (2 of 2)]
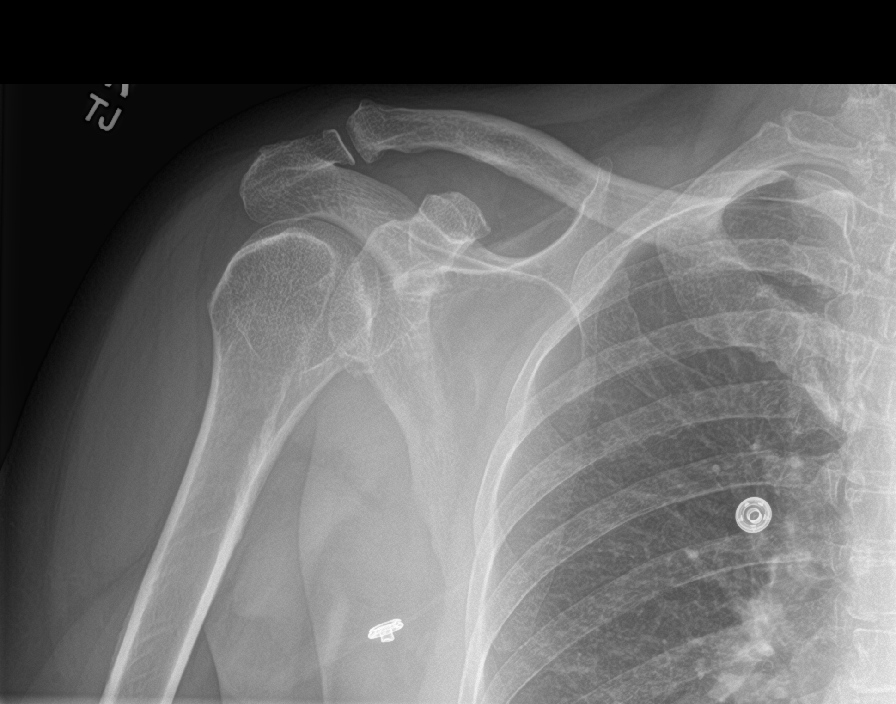

[humerus ap (1 of 2)]
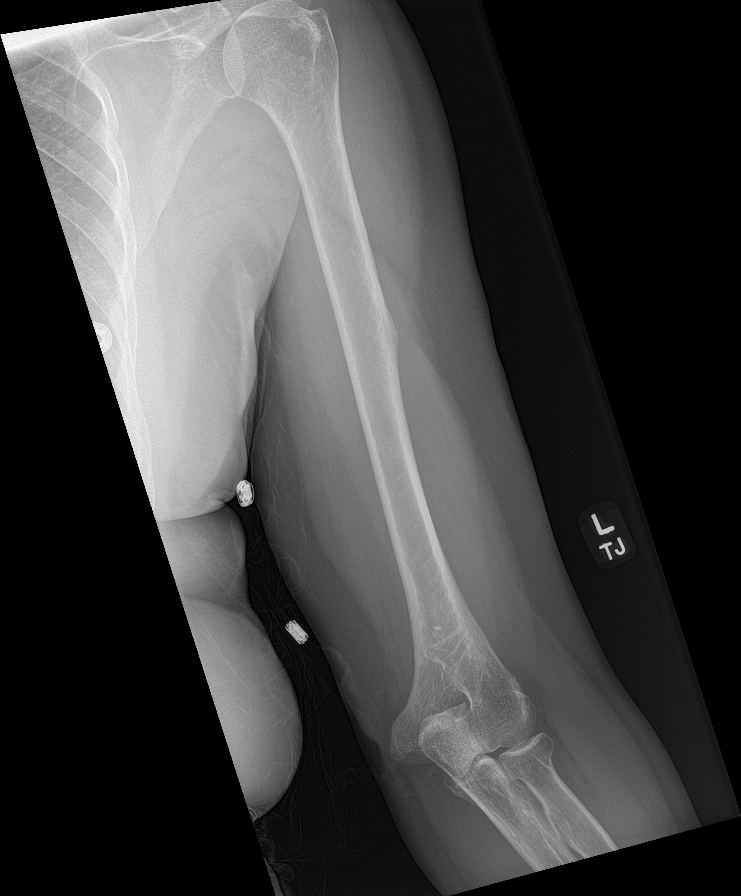

[humerus ap (2 of 2)]
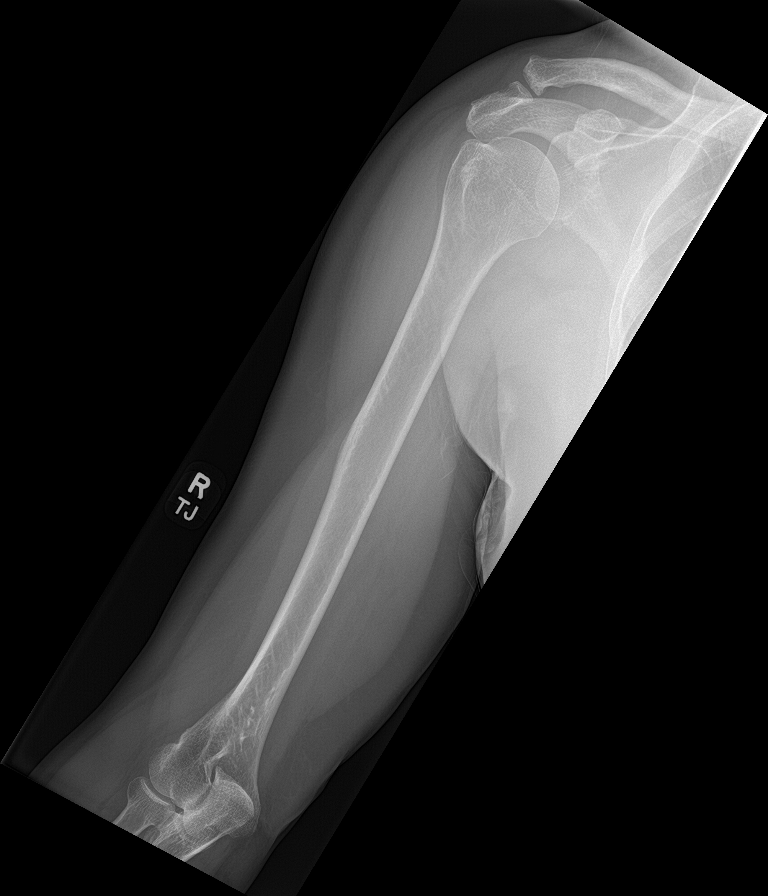

[forearm ap (1 of 2)]
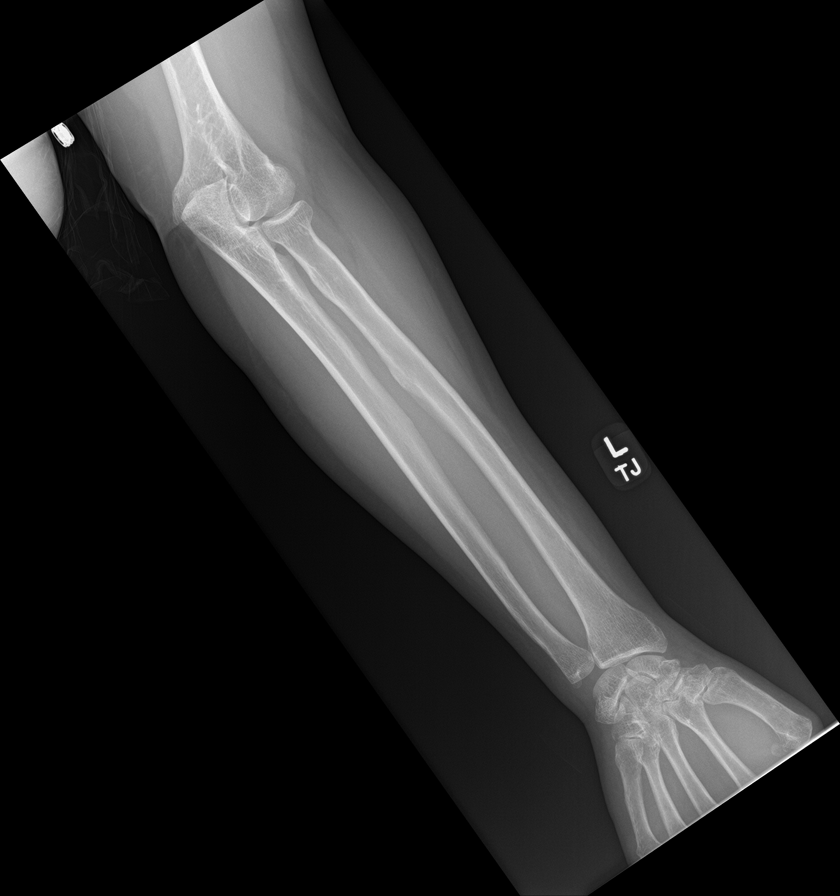

[forearm ap (2 of 2)]
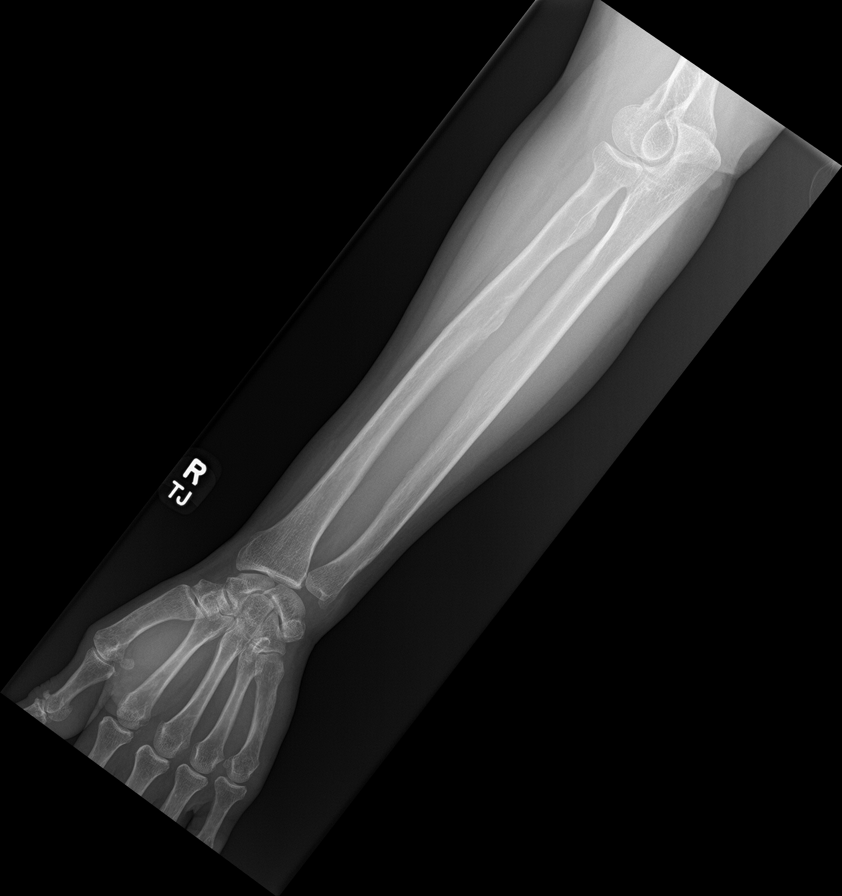

[c-spine ap]
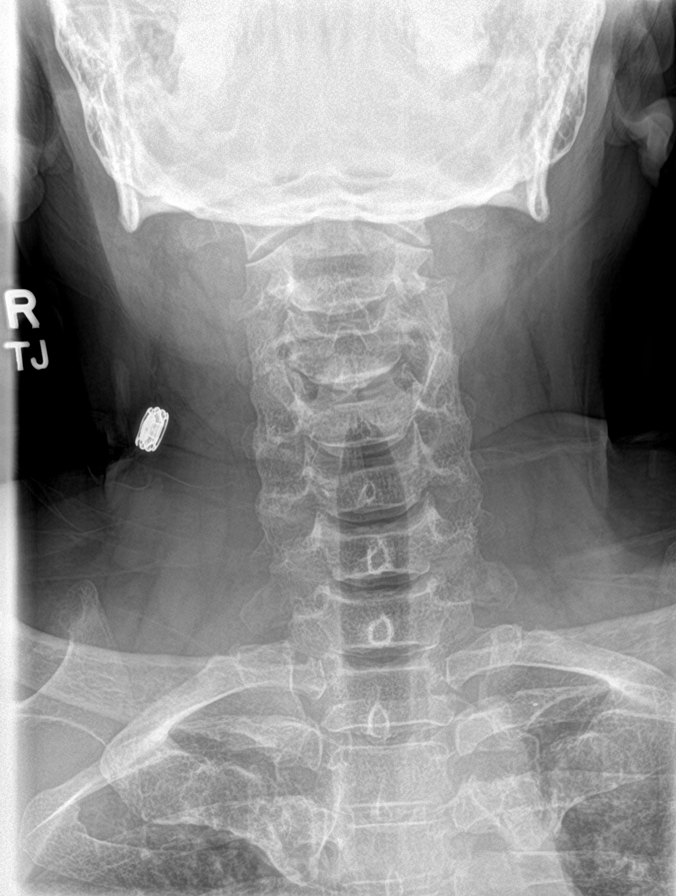

[8 of 10 positions shown; findings below may reference images not displayed]

FINDINGS: Lateral skull: Negative

Left shoulder: No lytic lesions. Chronic rotator cuff calcification.

Right shoulder: No lytic lesions.

Left humerus: No lytic lesions.

Right humerus: No lytic lesions.

Left forearm: No lytic lesions.

Right forearm: No lytic lesions.

Cervical spine: No lytic lesions. No significant degenerative
changes.

Thoracic spine: No fractures or lytic lesions. No significant
degenerative changes.

Lumbar spine: Transitional lumbosacral anatomy. No fractures or
lytic lesions. Degenerative changes of the nominal is transitional
articulation on the right. Degenerative anterolisthesis at L4-5 of 8
mm.

One-view chest: No active cardiopulmonary disease. No rib
abnormality seen.

AP pelvis: No lytic lesions.  Mild osteoarthritis of the hips.

Right femur: No lytic lesions.

Left femur: No lytic lesions.

Right tib fib: No lytic lesions.

Left hip fib: No lytic lesions.
IMPRESSION: Negative skeletal survey. No lytic lesions. Considerable lower
lumbar degenerative changes incidentally noted.

## 2021-06-28 NOTE — Progress Notes (Signed)
[place] [phone]   [phone]  PROGRESS NOTE  Patient Care Team: Merrilee Seashore, MD as PCP - General (Internal Medicine)   CHIEF COMPLAINTS/PURPOSE OF CONSULTATION:  MGUS  HISTORY OF PRESENTING ILLNESS:  Veronica [name] 55 y.o. 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 She is unaccompanied for this visit.   On exam today, Ms. [name] reports that her energy levels are stable. She continues to work and completes her daily activities independently. She has a good appetite and denies any recent weight changes. She denies nausea, vomiting or abdominal pain. She has chronic low back pain that has not changed in the recent few months. Her bowel habits are unchanged without diarrhea or constipation. Over the last six months, she has noticed hair loss. She experiences hot flashes that she suspects is secondary to going through  menopause. She denies fevers, chills, night sweats, shortness of breath, chest pain or cough. She has no other complaints. Rest of the 10 point ROS is below.   MEDICAL HISTORY:  Past Medical History:  Diagnosis Date   Hypertension     SURGICAL HISTORY: No past surgical history on file.  SOCIAL HISTORY: Social History   Socioeconomic History   Marital status: Single    Spouse name: Not on file   Number of children: Not on file   Years of education: Not on file   Highest education level: Not on file  Occupational History   Not on file  Tobacco Use   Smoking status: Never   Smokeless tobacco: Never  Substance and Sexual Activity   Alcohol use: Never   Drug use: Never   Sexual activity: Not on file  Other Topics Concern   Not on file  Social History Narrative   ** Merged History Encounter **       Pt living with her son. Right hand   Social Determinants of Health   Financial Resource Strain: Not on file  Food Insecurity: Not on file  Transportation Needs: Not on file  Physical Activity: Not on file   Stress: Not on file  Social Connections: Not on file  Intimate Partner Violence: Not on file    FAMILY HISTORY: Family History  Family history unknown: Yes    ALLERGIES:  is allergic to penicillins.  MEDICATIONS:  Current Outpatient Medications  Medication Sig Dispense Refill   amLODipine (NORVASC) 10 MG tablet Take 1 tablet by mouth daily.     No current facility-administered medications for this visit.    REVIEW OF SYSTEMS:   Constitutional: ( - ) fevers, ( - )  chills , ( - ) night sweats Eyes: ( - ) blurriness of vision, ( - ) double vision, ( - ) watery eyes Ears, nose, mouth, throat, and face: ( - ) mucositis, ( - ) sore throat Respiratory: ( - ) cough, ( - ) dyspnea, ( - ) wheezes Cardiovascular: ( - ) palpitation, ( - ) chest discomfort, ( - ) lower extremity swelling Gastrointestinal:  ( - ) nausea, ( - ) heartburn, ( - ) change in bowel habits Skin: ( - ) abnormal skin rashes Lymphatics: ( - ) new lymphadenopathy, ( - ) easy bruising Neurological: ( - ) numbness, ( - ) tingling, ( - ) new weaknesses Behavioral/Psych: ( - ) mood change, ( - ) new changes  All other systems were reviewed with the patient and are negative.  PHYSICAL EXAMINATION: ECOG PERFORMANCE STATUS: 0 - Asymptomatic  Vitals:   06/28/21  1450  BP: (!) 154/92  Pulse: 71  Resp: 20  Temp: (!) 97.5 F (36.4 C)  SpO2: 100%   Filed Weights   06/28/21 1450  Weight: 178 lb 8 oz (81 kg)    GENERAL: well appearing female in NAD  SKIN: skin color, texture, turgor are normal, no rashes or significant lesions EYES: conjunctiva are pink and non-injected, sclera clear OROPHARYNX: no exudate, no erythema; lips, buccal mucosa, and tongue normal  NECK: supple, non-tender LYMPH:  no palpable lymphadenopathy in the cervical or supraclavicular lymph nodes.  LUNGS: clear to auscultation and percussion with normal breathing effort HEART: regular rate & rhythm and no murmurs and no lower extremity  edema ABDOMEN: soft, non-tender, non-distended, normal bowel sounds Musculoskeletal: no cyanosis of digits and no clubbing  PSYCH: alert & oriented x 3, fluent speech NEURO: no focal motor/sensory deficits  LABORATORY DATA:  I have reviewed the data as listed CBC Latest Ref Rng & Units 06/19/2021 12/26/2020 11/14/2020  WBC 4.0 - 10.5 K/uL 4.8 5.0 3.1(L)  Hemoglobin 12.0 - 15.0 g/dL 12.7 12.2 12.8  Hematocrit 36.0 - 46.0 % 38.5 36.3 38.1  Platelets 150 - 400 K/uL 340 369 431(H)    CMP Latest Ref Rng & Units 12/26/2020 11/14/2020 11/12/2020  Glucose 70 - 99 mg/dL 104(H) 109(H) 97  BUN 6 - 20 mg/dL _0 Creatinine 0.44 - 1.00 mg/dL 0.68 0.62 0.61  Sodium 135 - 145 mmol/L 141 137 141  Potassium 3.5 - 5.1 mmol/L 4.0 3.4(L) 3.4(L)  Chloride 98 - 111 mmol/L 106 103 108  CO2 22 - 32 mmol/L _1 Calcium 8.9 - 10.3 mg/dL 9.4 9.1 9.5  Total Protein 6.5 - 8.1 g/dL 7.5 8.2(H) 7.0  Total Bilirubin 0.3 - 1.2 mg/dL 0.2(L) 0.7 0.9  Alkaline Phos 38 - 126 U/L 99 64 77  AST 15 - 41 U/L _2 ALT 0 - 44 U/L _3 ASSESSMENT & PLAN Veronica [name] is a 55 y.o. 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   #MGUS: --Most recent SPEP from May 2022 showed M-spike at 0.1 g/dL. IFE showed IgG monoclonal protein with lambda light chain specificity.  --Bone survey from today is pending.  --UPEP from April 2022 was unremarkable. Need to repeat at next visit.  --Patient will proceed with laboratory evaluation today and check SPEP with IFE, sFLC, CBC, CMP, LDH, Beta-2 microglobulin. --No need for bone marrow biopsy at this time.  --RTC in 6 months with labs prior unless today's labs require additional intervention.   No orders of the defined types were placed in this encounter.   All questions were answered. The patient knows to call the clinic with any problems, questions or concerns.  I have spent a total of 25 minutes minutes of face-to-face and non-face-to-face time, preparing to see the  [place] a medically appropriate examination, counseling and educating the patient, ordering medications/tests/procedures, documenting clinical information in the electronic health record, and care coordination.   Dede Query, PA-C Department of Hematology/Oncology [place] at [facility] [address] Phone: [phone]

## 2021-06-29 LAB — KAPPA/LAMBDA LIGHT CHAINS
Kappa free light chain: 15.8 mg/L (ref 3.3–19.4)
Kappa, lambda light chain ratio: 1.36 (ref 0.26–1.65)
Lambda free light chains: 11.6 mg/L (ref 5.7–26.3)

## 2021-06-29 LAB — BETA 2 MICROGLOBULIN, SERUM: Beta-2 Microglobulin: 1.2 mg/L (ref 0.6–2.4)

## 2021-07-03 LAB — MULTIPLE MYELOMA PANEL, SERUM
Albumin SerPl Elph-Mcnc: 4.1 g/dL (ref 2.9–4.4)
Albumin/Glob SerPl: 1.4 (ref 0.7–1.7)
Alpha 1: 0.2 g/dL (ref 0.0–0.4)
Alpha2 Glob SerPl Elph-Mcnc: 0.6 g/dL (ref 0.4–1.0)
B-Globulin SerPl Elph-Mcnc: 1.1 g/dL (ref 0.7–1.3)
Gamma Glob SerPl Elph-Mcnc: 1.1 g/dL (ref 0.4–1.8)
Globulin, Total: 3 g/dL (ref 2.2–3.9)
IgA: 162 mg/dL (ref 87–352)
IgG (Immunoglobin G), Serum: 1143 mg/dL (ref 586–1602)
IgM (Immunoglobulin M), Srm: 80 mg/dL (ref 26–217)
M Protein SerPl Elph-Mcnc: 0.1 g/dL — ABNORMAL HIGH
Total Protein ELP: 7.1 g/dL (ref 6.0–8.5)

## 2021-07-10 ENCOUNTER — Telehealth: Payer: Self-pay | Admitting: Physician Assistant

## 2021-07-10 NOTE — Telephone Encounter (Signed)
I called Veronica [name] to review the lab results from 06/28/2021. SPEP identified M-protein at 0.1 g/dL. IFE showed IgG monoclonal protein with lambda light chain specificity. Serum free light chain ratio was normal. CBC and CMP were unremarkable. Findings are consistent with MGUS. Recommend to continue to monitor and return 6 months with repeat labs.

## 2021-08-02 DIAGNOSIS — M5440 Lumbago with sciatica, unspecified side: Secondary | ICD-10-CM | POA: Diagnosis not present

## 2021-08-02 DIAGNOSIS — D472 Monoclonal gammopathy: Secondary | ICD-10-CM | POA: Diagnosis not present

## 2021-08-02 DIAGNOSIS — I10 Essential (primary) hypertension: Secondary | ICD-10-CM | POA: Diagnosis not present

## 2021-08-02 DIAGNOSIS — Z0000 Encounter for general adult medical examination without abnormal findings: Secondary | ICD-10-CM | POA: Diagnosis not present

## 2021-10-02 ENCOUNTER — Ambulatory Visit: Payer: BC Managed Care – PPO | Admitting: Neurology

## 2021-10-18 ENCOUNTER — Telehealth: Payer: Self-pay | Admitting: Hematology and Oncology

## 2021-10-18 NOTE — Telephone Encounter (Signed)
Rescheduled appointment per providers template. Left message.  ? ?

## 2021-11-16 ENCOUNTER — Telehealth: Payer: Self-pay | Admitting: Hematology and Oncology

## 2021-11-16 NOTE — Telephone Encounter (Signed)
Rescheduled appointment per providers template. Left message.  ? ?

## 2021-12-08 ENCOUNTER — Other Ambulatory Visit (HOSPITAL_COMMUNITY): Payer: Self-pay

## 2021-12-27 ENCOUNTER — Other Ambulatory Visit: Payer: BC Managed Care – PPO

## 2021-12-27 ENCOUNTER — Ambulatory Visit: Payer: BC Managed Care – PPO | Admitting: Hematology and Oncology

## 2022-01-03 ENCOUNTER — Inpatient Hospital Stay: Payer: Self-pay | Admitting: Hematology and Oncology

## 2022-01-03 ENCOUNTER — Inpatient Hospital Stay: Payer: Self-pay

## 2022-02-01 DIAGNOSIS — I10 Essential (primary) hypertension: Secondary | ICD-10-CM | POA: Diagnosis not present

## 2022-02-01 DIAGNOSIS — Z0000 Encounter for general adult medical examination without abnormal findings: Secondary | ICD-10-CM | POA: Diagnosis not present

## 2022-02-01 DIAGNOSIS — D472 Monoclonal gammopathy: Secondary | ICD-10-CM | POA: Diagnosis not present

## 2022-02-08 DIAGNOSIS — R21 Rash and other nonspecific skin eruption: Secondary | ICD-10-CM | POA: Diagnosis not present

## 2022-02-08 DIAGNOSIS — D472 Monoclonal gammopathy: Secondary | ICD-10-CM | POA: Diagnosis not present

## 2022-02-08 DIAGNOSIS — I10 Essential (primary) hypertension: Secondary | ICD-10-CM | POA: Diagnosis not present

## 2022-06-14 DIAGNOSIS — Z0000 Encounter for general adult medical examination without abnormal findings: Secondary | ICD-10-CM | POA: Diagnosis not present

## 2022-06-21 DIAGNOSIS — I10 Essential (primary) hypertension: Secondary | ICD-10-CM | POA: Diagnosis not present

## 2022-06-21 DIAGNOSIS — Z0000 Encounter for general adult medical examination without abnormal findings: Secondary | ICD-10-CM | POA: Diagnosis not present

## 2022-07-02 DIAGNOSIS — Z1211 Encounter for screening for malignant neoplasm of colon: Secondary | ICD-10-CM | POA: Diagnosis not present

## 2022-07-02 DIAGNOSIS — Z1212 Encounter for screening for malignant neoplasm of rectum: Secondary | ICD-10-CM | POA: Diagnosis not present

## 2022-07-12 LAB — COLOGUARD: COLOGUARD: NEGATIVE

## 2022-07-12 LAB — EXTERNAL GENERIC LAB PROCEDURE: COLOGUARD: NEGATIVE

## 2022-08-21 DIAGNOSIS — Z01419 Encounter for gynecological examination (general) (routine) without abnormal findings: Secondary | ICD-10-CM | POA: Diagnosis not present

## 2022-08-21 DIAGNOSIS — Z1151 Encounter for screening for human papillomavirus (HPV): Secondary | ICD-10-CM | POA: Diagnosis not present

## 2022-10-01 ENCOUNTER — Other Ambulatory Visit: Payer: Self-pay | Admitting: Internal Medicine

## 2022-10-01 ENCOUNTER — Ambulatory Visit
Admission: RE | Admit: 2022-10-01 | Discharge: 2022-10-01 | Disposition: A | Discharge status: Home / Self Care | Payer: BC Managed Care – PPO | Source: Ambulatory Visit | Attending: Internal Medicine | Admitting: Internal Medicine

## 2022-10-01 DIAGNOSIS — Z1231 Encounter for screening mammogram for malignant neoplasm of breast: Secondary | ICD-10-CM

## 2023-06-26 DIAGNOSIS — Z0000 Encounter for general adult medical examination without abnormal findings: Secondary | ICD-10-CM | POA: Diagnosis not present

## 2023-06-26 DIAGNOSIS — I10 Essential (primary) hypertension: Secondary | ICD-10-CM | POA: Diagnosis not present

## 2023-08-05 ENCOUNTER — Other Ambulatory Visit: Payer: Self-pay | Admitting: Internal Medicine

## 2023-08-05 DIAGNOSIS — Z1231 Encounter for screening mammogram for malignant neoplasm of breast: Secondary | ICD-10-CM

## 2023-10-03 ENCOUNTER — Ambulatory Visit: Payer: BC Managed Care – PPO

## 2023-10-15 ENCOUNTER — Ambulatory Visit
Admission: RE | Admit: 2023-10-15 | Discharge: 2023-10-15 | Disposition: A | Discharge status: Home / Self Care | Payer: BC Managed Care – PPO | Source: Ambulatory Visit | Attending: Internal Medicine | Admitting: Internal Medicine

## 2023-10-15 DIAGNOSIS — Z1231 Encounter for screening mammogram for malignant neoplasm of breast: Secondary | ICD-10-CM

## 2024-06-29 ENCOUNTER — Telehealth (INDEPENDENT_AMBULATORY_CARE_PROVIDER_SITE_OTHER): Payer: Self-pay

## 2024-06-29 ENCOUNTER — Other Ambulatory Visit: Payer: Self-pay

## 2024-06-29 ENCOUNTER — Emergency Department (HOSPITAL_BASED_OUTPATIENT_CLINIC_OR_DEPARTMENT_OTHER)
Admission: EM | Admit: 2024-06-29 | Discharge: 2024-06-29 | Disposition: A | Discharge status: Home / Self Care | Attending: Emergency Medicine | Admitting: Emergency Medicine

## 2024-06-29 DIAGNOSIS — R040 Epistaxis: Secondary | ICD-10-CM | POA: Diagnosis present

## 2024-06-29 MED ORDER — OXYMETAZOLINE HCL 0.05 % NA SOLN
1.0000 | Freq: Once | NASAL | Status: AC
  Administered 2024-06-29: 1 via NASAL
  Filled 2024-06-29: qty 30

## 2024-06-29 NOTE — ED Triage Notes (Signed)
 Reports nose bleed x 30 mins. Hx of HTN. Denies injury to area.

## 2024-06-29 NOTE — Telephone Encounter (Signed)
 Spoke with patient let her know she can still work just no heavy lifting patient understood and also explained to her that she can spray afrin into her packing until she comes in for her follow up patient understood.

## 2024-06-29 NOTE — Telephone Encounter (Signed)
 Patients manager called and stated that patient is bleeding through the packing explained that she would need to go back to the ER if she is bleeding through the packing Patient and manager understood.

## 2024-06-29 NOTE — ED Provider Notes (Signed)
 [place] EMERGENCY DEPARTMENT AT [facility] Provider Note   CSN: [id number] Arrival date & time: 06/29/24  1057     Patient presents with: Epistaxis   Veronica [name] is a 58 y.o. female.   Patient here with nosebleed that started 30 minutes prior to arrival.  No injury to the area.  Just darted bleeding from the left nare profusely.  She has been tried to hold pressure without much improvement.  She is coughing up blood as well.  She denies any weakness numbness or tingling.  She has a history of nosebleeds.  Is not on any blood thinners.  The history is provided by the patient.       Prior to Admission medications   Medication Sig Start Date End Date Taking? Authorizing Provider  amLODipine  (NORVASC ) 10 MG tablet Take 1 tablet by mouth daily. 12/01/20   [provider]    Allergies: Penicillins    Review of Systems  Updated Vital Signs BP (!) 167/107 (BP Location: Left Arm)   Pulse 97   Resp 17   SpO2 98%   Physical Exam Vitals and nursing note reviewed.  Constitutional:      General: She is not in acute distress.    Appearance: She is well-developed. She is not ill-appearing.  HENT:     Head: Normocephalic and atraumatic.     Nose:     Comments: Very brisk bleeding from the left nare    Mouth/Throat:     Mouth: Mucous membranes are moist.  Eyes:     Extraocular Movements: Extraocular movements intact.     Conjunctiva/sclera: Conjunctivae normal.     Pupils: Pupils are equal, round, and reactive to light.  Cardiovascular:     Rate and Rhythm: Normal rate and regular rhythm.     Pulses: Normal pulses.     Heart sounds: Normal heart sounds. No murmur heard. Pulmonary:     Effort: Pulmonary effort is normal. No respiratory distress.     Breath sounds: Normal breath sounds.  Abdominal:     Palpations: Abdomen is soft.     Tenderness: There is no abdominal tenderness.  Musculoskeletal:        General: No swelling.     Cervical  back: Normal range of motion and neck supple.  Skin:    General: Skin is warm and dry.     Capillary Refill: Capillary refill takes less than 2 seconds.  Neurological:     Mental Status: She is alert.  Psychiatric:        Mood and Affect: Mood normal.     (all labs ordered are listed, but only abnormal results are displayed) Labs Reviewed - No data to display  EKG: None  Radiology: No results found.   Epistaxis Management  Date/Time: 06/29/2024 11:21 AM  Performed by: Ruthe Cornet, DO Authorized by: Ruthe Cornet, DO   Consent:    Consent obtained:  Verbal   Consent given by:  Patient   Risks, benefits, and alternatives were discussed: yes     Risks discussed:  Bleeding, infection, nasal injury and pain   Alternatives discussed:  No treatment Universal protocol:    Procedure explained and questions answered to patient or proxy's satisfaction: yes     Patient identity confirmed:  Verbally with patient Anesthesia:    Anesthesia method:  None Procedure details:    Treatment site:  L anterior   Treatment method:  Anterior pack   Treatment complexity:  Limited   Treatment episode:  initial   Post-procedure details:    Assessment:  Bleeding stopped   Procedure completion:  Tolerated    Medications Ordered in the ED  oxymetazoline (AFRIN) 0.05 % nasal spray 1 spray (has no administration in time range)                                    Medical Decision Making Risk OTC drugs.   Veronica [name] is here with nosebleed.  Patient with very heavy bleeding from the left nare tried Afrin and pressure but unable to get bleeding to stop sufficiently.  She was spitting out a lot of blood as well.  Cannot really identify true location of the bleeding from the left nare.  She has got unremarkable vitals.  No fever.  Not on blood thinners.  Ultimately decision was made to put an anterior packing in with Rhino Rocket and Afrin.  She has tolerated this very well.  Will  observe to see if bleeding is controlled.  Overall bleeding has been controlled with nasal packing.  Will have her follow-up with ENT.  Told to return if symptoms worsen.  Discharge.  This chart was dictated using voice recognition software.  Despite best efforts to proofread,  errors can occur which can change the documentation meaning.      Final diagnoses:  Epistaxis    ED Discharge Orders     None          Ruthe Cornet, DO 06/29/24 1217

## 2024-06-29 NOTE — Discharge Instructions (Signed)
 Keep nasal packing in place.  Follow-up with ear nose and throat doctor.  Return if symptoms worsen.

## 2024-07-01 ENCOUNTER — Other Ambulatory Visit: Payer: Self-pay

## 2024-07-01 ENCOUNTER — Encounter (HOSPITAL_COMMUNITY): Payer: Self-pay | Admitting: Emergency Medicine

## 2024-07-01 ENCOUNTER — Emergency Department (HOSPITAL_COMMUNITY)
Admission: EM | Admit: 2024-07-01 | Discharge: 2024-07-01 | Disposition: A | Discharge status: Home / Self Care | Attending: Emergency Medicine | Admitting: Emergency Medicine

## 2024-07-01 DIAGNOSIS — I10 Essential (primary) hypertension: Secondary | ICD-10-CM | POA: Insufficient documentation

## 2024-07-01 DIAGNOSIS — Z79899 Other long term (current) drug therapy: Secondary | ICD-10-CM | POA: Diagnosis not present

## 2024-07-01 DIAGNOSIS — R040 Epistaxis: Secondary | ICD-10-CM | POA: Insufficient documentation

## 2024-07-01 MED ORDER — OXYMETAZOLINE HCL 0.05 % NA SOLN: 1.0000 | Freq: Two times a day (BID) | NASAL | 0 refills | Status: DC

## 2024-07-01 NOTE — Discharge Instructions (Addendum)
 Please follow-up with the ENT appointment in 2 days.  Seek emergency care if experiencing any new or worsening symptoms.

## 2024-07-01 NOTE — ED Provider Notes (Signed)
 [place] EMERGENCY DEPARTMENT AT [facility] Provider Note   CSN: [id number] Arrival date & time: 07/01/24  9656     Patient presents with: Epistaxis   Veronica [name] is a 58 y.o. female with PMHx HTN, headaches who presents to ED concerned for epistaxis. Patient sneezed 2 days ago and ended up having a brisk nose bleed from left nostril and was seen at [facility] ED and a Rhinorocket was placed. Patient with follow up appointment with ENT in 2 days. Patient was sleeping when she woke up with bleeding coming from the right nostril around 1 hour PTA. Patient held pressure and bleeding resolved within 10 min.  Patient denies any other acute complaints or infectious symptoms today.  Patient denies blood thinner use or clotting disorders. No recent facial trauma.    Epistaxis      Prior to Admission medications   Medication Sig Start Date End Date Taking? Authorizing Provider  amLODipine  (NORVASC ) 10 MG tablet Take 1 tablet by mouth daily. 12/01/20   [provider]    Allergies: Penicillins    Review of Systems  HENT:  Positive for nosebleeds.     Updated Vital Signs BP (!) 181/99   Pulse 92   Temp 98 F (36.7 C)   Resp 17   SpO2 100%   Physical Exam Vitals and nursing note reviewed.  Constitutional:      General: She is not in acute distress.    Appearance: She is not ill-appearing or toxic-appearing.  HENT:     Head: Normocephalic and atraumatic.     Nose:     Comments: No active bleeding from BL nares. No postnasal bleeding appreciated. Left nasal Rhinorocket in place. Eyes:     General: No scleral icterus.       Right eye: No discharge.        Left eye: No discharge.     Conjunctiva/sclera: Conjunctivae normal.  Cardiovascular:     Rate and Rhythm: Normal rate.  Pulmonary:     Effort: Pulmonary effort is normal.  Abdominal:     General: Abdomen is flat.  Skin:    General: Skin is warm and dry.  Neurological:     General: No focal  deficit present.     Mental Status: She is alert. Mental status is at baseline.  Psychiatric:        Mood and Affect: Mood normal.        Behavior: Behavior normal.     (all labs ordered are listed, but only abnormal results are displayed) Labs Reviewed - No data to display  EKG: None  Radiology: No results found.   Procedures   Medications Ordered in the ED - No data to display                                  Medical Decision Making  This patient presents to the ED for concern of epistaxis, this involves an extensive number of treatment options, and is a complaint that carries with it a high risk of complications and morbidity.  The differential diagnosis includes hemorrhage, epistaxis, fracture, etc.   Co morbidities that complicate the patient evaluation  HTN, headaches   Additional history obtained:  Additional history obtained from 11/17 ED note: patient with brisk bleeding from left nostril. Bleeding stopped after Rhinorocket application.    Problem List / ED Course / Critical interventions / Medication management  Patient  presents to ED concern for epistaxis.  Patient states she woke up with bleeding coming from her right nostril.  Bleeding stopped after 10 minutes of applied pressure.  Physical exam without any concern for bleeding from the right nostril.  Left nostril Rhino Rocket is intact.  Rest of physical exam reassuring.  Patient afebrile with stable vitals. We observed patient for a little over an hour in ED.  No concerns for recurrent epistaxis.  Patient encouraged to follow-up with her ENT appointment in 2 days.  Patient agreeable with plan. I have reviewed the patients home medicines and have made adjustments as needed The patient has been appropriately medically screened and/or stabilized in the ED. I have low suspicion for any other emergent medical condition which would require further screening, evaluation or treatment in the ED or require  inpatient management. At time of discharge the patient is hemodynamically stable and in no acute distress. I have discussed work-up results and diagnosis with patient and answered all questions. Patient is agreeable with discharge plan. We discussed strict return precautions for returning to the emergency department and they verbalized understanding.     Social Determinants of Health:  none      Final diagnoses:  Epistaxis    ED Discharge Orders     None          Hoy Nidia FALCON, PA-C 07/01/24 0439    Lorette Mayo, MD 07/01/24 [phone]

## 2024-07-01 NOTE — ED Triage Notes (Signed)
 Pt return to ED today for new onset of nose bleed from the right side now. Pt having and appointment with ENT.

## 2024-07-03 ENCOUNTER — Encounter (INDEPENDENT_AMBULATORY_CARE_PROVIDER_SITE_OTHER): Payer: Self-pay

## 2024-07-03 ENCOUNTER — Ambulatory Visit (INDEPENDENT_AMBULATORY_CARE_PROVIDER_SITE_OTHER)

## 2024-07-03 VITALS — BP 122/79 | HR 74 | Wt 186.0 lb

## 2024-07-03 DIAGNOSIS — R040 Epistaxis: Secondary | ICD-10-CM | POA: Diagnosis not present

## 2024-07-03 MED ORDER — AYR SALINE NASAL NA GEL: 1.0000 | Freq: Every evening | NASAL | 3 refills | Status: AC

## 2024-07-03 MED ORDER — OXYMETAZOLINE HCL 0.05 % NA SOLN: 1.0000 | Freq: Two times a day (BID) | NASAL | 3 refills | Status: AC

## 2024-07-03 NOTE — Progress Notes (Signed)
 Dear Dr. Verdia, Here is my assessment for our mutual patient, Veronica [name]. Thank you for allowing me the opportunity to care for your patient. Please do not hesitate to contact me should you have any other questions. Sincerely, Dr. Hadassah Parody  Otolaryngology Clinic Note Referring provider: Dr. Verdia HPI:   Initial HPI (07/03/2024) Discussed the use of AI scribe software for clinical note transcription with the patient, who gave verbal consent to proceed.  History of Present Illness Veronica [name] is a 57 year old female who presents for nasal packing removal after recent episode of epistaxis.   Epistaxis - Recurrent episodes of epistaxis, initially from the left nasal cavity, managed with nasal packing - Subsequent episode of bleeding from the right nasal cavity occurred two days later - No further episodes of epistaxis since the initial events - History of epistaxis during youth, but none in adulthood aside from these two episodes this week   Nasal dryness - Intermittent nasal dryness, suspected to contribute to recent epistaxis  Medication access - No Afrin nasal spray available at home due to inability to purchase previously   Independent Review of Additional Tests or Records:  ED note 07/01/24 Nidia Mays PA-C: return to ED for contralateral side epistaxis. Resolved prior to arrival. Follow-up with ENT  ED note 06/29/24 Juliene Bicker, DO: profuse left sided bleeding. Left nasal packing placed and f/u with ENT   06/28/21 Hgb 12.7  PMH/Meds/All/SocHx/FamHx/ROS:   Past Medical History:  Diagnosis Date   Hypertension      History reviewed. No pertinent surgical history.  Family History  Family history unknown: Yes     Social Connections: Not on file     Current Outpatient Medications  Medication Instructions   amLODipine  (NORVASC ) 10 MG tablet 1 tablet, Daily   oxymetazoline  (AFRIN NASAL SPRAY) 0.05 % nasal spray 1 spray,  Each Nare, 2 times daily   saline (AYR) GEL 1 Application, Each Nare, Nightly     Physical Exam:   BP 122/79 (BP Location: Left Arm, Patient Position: Sitting, Cuff Size: Normal)   Pulse 74   Wt 186 lb (84.4 kg)   SpO2 94%   BMI 30.95 kg/m   Salient findings:  Left nasal packing removed without any additional bleeding Anterior rhinoscopy shows bilateral nasal mucosal dryness, no active bleeding  Seprately Identifiable Procedures:  Prior to initiating any procedures, risks/benefits/alternatives were explained to the patient and verbal consent obtained. None  Impression & Plans:  Veronica [name] is a 58 y.o. female with   1. Epistaxis    Assessment and Plan Assessment & Plan Epistaxis secondary to nasal dryness Epistaxis occurred on the left side, followed by bleeding on the right side two days later. No further bleeding since. Likely due to nasal dryness, common in winter months. Nasal examination shows dryness but no concerning findings. No need for further investigation unless bleeding recurs. - Prescribed saline gel for nasal use to maintain humidity. - Prescribed Afrin nasal spray for use in case of recurrent epistaxis. - Advised use of a humidifier to maintain nasal moisture. - Instructed on Afrin application: two sprays on affected side and apply firm pressure for ten minutes. - Advised to seek emergency care if bleeding does not stop after Afrin application. - No follow-up appointment needed unless epistaxis recurs and at that time can further evaluate     See below regarding exact medications prescribed this encounter including dosages and route: Meds ordered this encounter  Medications   oxymetazoline  (AFRIN NASAL SPRAY) 0.05 %  nasal spray    Sig: Place 1 spray into both nostrils 2 (two) times daily.    Dispense:  30 mL    Refill:  3   saline (AYR) GEL    Sig: Place 1 Application into both nostrils at bedtime.    Dispense:  14.1 g    Refill:  3       Thank you for allowing me the opportunity to care for your patient. Please do not hesitate to contact me should you have any other questions.  Sincerely, Hadassah Parody, MD Otolaryngologist (ENT), [facility] Phone: [phone] Fax: [phone]  MDM:  Level 4 Complexity/Problems addressed: low Data complexity: mod independent review of 2 notes, lab - Morbidity: mod  - Prescription Drug prescribed or managed: yes
